# Patient Record
Sex: Male | Born: 1970 | Race: White | Hispanic: No | Smoking: Current every day smoker
Health system: Southern US, Community
[De-identification: ages and names within clinical notes are randomized; demographics above are authoritative.]

## PROBLEM LIST (undated history)

## (undated) DIAGNOSIS — S92009A Unspecified fracture of unspecified calcaneus, initial encounter for closed fracture: Secondary | ICD-10-CM

---

## 2018-06-26 ENCOUNTER — Other Ambulatory Visit: Payer: Self-pay

## 2018-06-26 ENCOUNTER — Emergency Department (HOSPITAL_COMMUNITY): Payer: PRIVATE HEALTH INSURANCE

## 2018-06-26 ENCOUNTER — Emergency Department (HOSPITAL_COMMUNITY)
Admission: EM | Admit: 2018-06-26 | Discharge: 2018-06-26 | Disposition: A | Discharge status: Home / Self Care | Payer: PRIVATE HEALTH INSURANCE | Attending: Emergency Medicine | Admitting: Emergency Medicine

## 2018-06-26 DIAGNOSIS — S99821A Other specified injuries of right foot, initial encounter: Secondary | ICD-10-CM | POA: Diagnosis present

## 2018-06-26 DIAGNOSIS — Y939 Activity, unspecified: Secondary | ICD-10-CM | POA: Diagnosis not present

## 2018-06-26 DIAGNOSIS — S92001A Unspecified fracture of right calcaneus, initial encounter for closed fracture: Secondary | ICD-10-CM | POA: Diagnosis not present

## 2018-06-26 DIAGNOSIS — Y990 Civilian activity done for income or pay: Secondary | ICD-10-CM

## 2018-06-26 DIAGNOSIS — Y998 Other external cause status: Secondary | ICD-10-CM | POA: Insufficient documentation

## 2018-06-26 DIAGNOSIS — W19XXXA Unspecified fall, initial encounter: Secondary | ICD-10-CM | POA: Insufficient documentation

## 2018-06-26 DIAGNOSIS — M25571 Pain in right ankle and joints of right foot: Secondary | ICD-10-CM

## 2018-06-26 DIAGNOSIS — Y9289 Other specified places as the place of occurrence of the external cause: Secondary | ICD-10-CM | POA: Insufficient documentation

## 2018-06-26 DIAGNOSIS — M79671 Pain in right foot: Secondary | ICD-10-CM

## 2018-06-26 NOTE — ED Notes (Signed)
Patient transported to CT 

## 2018-06-26 NOTE — ED Notes (Signed)
Ortho paged. 

## 2018-06-26 NOTE — ED Notes (Signed)
Pt verbalized understanding of discharge instructions and follow up care

## 2018-06-26 NOTE — Consult Note (Signed)
Reason for Consult:Right calc fx Referring Physician: [name] is an 48 y.o. male.  HPI: [name] [name] was working and was on the side of a dump truck when he slipped off and fell about 5-6 feet. He landed on his right foot and then twisted as he fell to the ground. He had immediate pain and could not bear weight on that foot. This happened Monday and he's been icing it and trying to tough it out but did go to UC today. X-rays showed a calcaneus fx and he was directed to the ED where orthopedic surgery was consulted.  No past medical history on file.  No family history on file.  Social History:  has no history on file for tobacco, alcohol, and drug.  Allergies: Not on File  Medications: I have reviewed the patient's current medications.  No results found for this or any previous visit (from the past 48 hour(s)).  Dg Tibia/fibula Right  Result Date: 06/26/2018 CLINICAL DATA:  Initial evaluation for acute trauma, fall. EXAM: RIGHT TIBIA AND FIBULA - 2 VIEW COMPARISON:  None. FINDINGS: Acute comminuted fracture involving the right calcaneus noted, better evaluated on concomitant radiographs of the right ankle and foot. Associated soft tissue swelling about the right ankle and right hindfoot. No other acute osseous abnormality about the right tibia/fibula. Osseous mineralization normal. No other soft tissue abnormality. Limited views of the right knee unremarkable. IMPRESSION: 1. Acute comminuted fracture involving the right calcaneus, better evaluated on concomitant radiographs of the right ankle and foot. 2. No other acute osseous abnormality about the right tibia/fibula. Electronically Signed   By: Jeannine Boga M.D.   On: 06/26/2018 13:49   Dg Ankle Complete Right  Result Date: 06/26/2018 CLINICAL DATA:  Initial evaluation for acute trauma, fall. EXAM: RIGHT ANKLE - COMPLETE 3+ VIEW COMPARISON:  Concomitant radiograph of the right foot. FINDINGS: Acute comminuted fracture of  the right calcaneus with intra-articular extension. No other acute fracture or dislocation. Talus grossly intact. Ankle mortise approximated. Diffuse soft tissue swelling about the ankle and hindfoot. IMPRESSION: Acute comminuted fracture of the right calcaneus with intra-articular extension. Electronically Signed   By: Jeannine Boga M.D.   On: 06/26/2018 13:51   Dg Foot Complete Right  Result Date: 06/26/2018 CLINICAL DATA:  Initial evaluation for acute trauma, fall. EXAM: RIGHT FOOT COMPLETE - 3+ VIEW COMPARISON:  None. FINDINGS: Acute comminuted fracture of the right calcaneus with intra-articular extension. Associated soft tissue swelling about the right ankle and hindfoot. No other acute fracture or dislocation. Joint spaces maintained. Osseous mineralization within normal limits. IMPRESSION: Acute comminuted fracture of the right calcaneus with intra-articular extension. Electronically Signed   By: Jeannine Boga M.D.   On: 06/26/2018 13:53    Review of Systems  Constitutional: Negative for weight loss.  HENT: Negative for ear discharge, ear pain, hearing loss and tinnitus.   Eyes: Negative for blurred vision, double vision, photophobia and pain.  Respiratory: Negative for cough, sputum production and shortness of breath.   Cardiovascular: Negative for chest pain.  Gastrointestinal: Negative for abdominal pain, nausea and vomiting.  Genitourinary: Negative for dysuria, flank pain, frequency and urgency.  Musculoskeletal: Positive for joint pain (Right foot). Negative for back pain, falls, myalgias and neck pain.  Neurological: Negative for dizziness, tingling, sensory change, focal weakness, loss of consciousness and headaches.  Endo/Heme/Allergies: Does not bruise/bleed easily.  Psychiatric/Behavioral: Negative for depression, memory loss and substance abuse. The patient is not nervous/anxious.    Blood pressure Marland Kitchen)  150/107, pulse 91, temperature 98.7 F (37.1 C), temperature  source Oral, resp. rate 16, height 5\' 11"  (1.803 m), weight 70.3 kg, SpO2 99 %. Physical Exam  Constitutional: He appears well-developed and well-nourished. No distress.  HENT:  Head: Normocephalic and atraumatic.  Eyes: Conjunctivae are normal. Right eye exhibits no discharge. Left eye exhibits no discharge. No scleral icterus.  Neck: Normal range of motion.  Cardiovascular: Normal rate and regular rhythm.  Respiratory: Effort normal. No respiratory distress.  Musculoskeletal:     Comments: RLE No traumatic wounds or rash, extensive ecchymoses foot, significant foot/ankle edema  Mod TTP heel  No knee effusion  Knee stable to varus/ valgus and anterior/posterior stress  Sens DPN, SPN, TN intact  Motor EHL, ext, flex, evers 5/5  DP 1+, PT 1+ (likely 2/2 edema)  Neurological: He is alert.  Skin: Skin is warm and dry. He is not diaphoretic.  Psychiatric: He has a normal mood and affect. His behavior is normal.    Assessment/Plan: Right calc fx -- Jones splint, NWB, f/u with Dr. Carola FrostHandy on Wednesday to discuss surgical fixation.    [name], PA-C Orthopedic Surgery [phone] 06/26/2018, 2:59 PM

## 2018-06-26 NOTE — ED Notes (Signed)
Pt to xray

## 2018-06-26 NOTE — ED Provider Notes (Signed)
[facility] Provider Note   CSN: [id number] Arrival date & time: 06/26/18  1230    History   Chief Complaint Chief Complaint  Patient presents with  . Work Related Injury    HPI [name] [name] is a 48 y.o. male with no significant past medical history presenting after a work injury onset 5 days ago. 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 Patient states fall was about 6 feet. Patient reports edema and bruising. Patient states he is not able to put pressure on right foot. Patient reports taking BC powder and applying ice with significant relief. Patient denies numbness or paresthesias. Patient describes pain as sharp with standing, but denies any pain at rest.  Patient states he was seen at an UC this morning and advised to come to the ER. Patient denies fever, chills, nausea, vomiting, neck pain, back pain, headache, dizziness, LOC, or head injury. Patient denies sick exposures or recent travel.      HPI  No past medical history on file.  There are no active problems to display for this patient.         Home Medications    Prior to Admission medications   Medication Sig Start Date End Date Taking? Authorizing Provider  Aspirin-Caffeine (BC FAST PAIN RELIEF PO) Take 1 packet by mouth daily as needed (headache).   Yes [provider]    Family History No family history on file.  Social History Social History   Tobacco Use  . Smoking status: Not on file  Substance Use Topics  . Alcohol use: Not on file  . Drug use: Not on file     Allergies   Patient has no known allergies.   Review of Systems Review of Systems  Constitutional: Negative for activity change, chills, diaphoresis and fever.  Respiratory: Negative for shortness of breath.   Cardiovascular: Negative for chest pain.  Gastrointestinal: Negative for abdominal pain,  nausea and vomiting.  Musculoskeletal: Positive for arthralgias and joint swelling. Negative for back pain, gait problem, myalgias, neck pain and neck stiffness.  Skin: Positive for color change. Negative for rash and wound.  Allergic/Immunologic: Negative for immunocompromised state.  Neurological: Negative for dizziness, weakness, numbness and headaches.  Hematological: Does not bruise/bleed easily.     Physical Exam Updated Vital Signs BP 124/86 (BP Location: Right Arm)   Pulse 74   Temp 98.7 F (37.1 C) (Oral)   Resp 16   Ht 5\' 11"  (1.803 m)   Wt 70.3 kg   SpO2 100%   BMI 21.62 kg/m   Physical Exam Vitals signs and nursing note reviewed.  Constitutional:      General: He is not in acute distress.    Appearance: He is well-developed. He is not diaphoretic.  HENT:     Head: Normocephalic and atraumatic.  Neck:     Musculoskeletal: Normal range of motion.  Cardiovascular:     Rate and Rhythm: Normal rate and regular rhythm.     Heart sounds: Normal heart sounds. No murmur. No friction rub. No gallop.   Pulmonary:     Effort: Pulmonary effort is normal. No respiratory distress.     Breath sounds: Normal breath sounds. No wheezing or rales.  Abdominal:     Palpations: Abdomen is soft.     Tenderness: There is no abdominal tenderness.  Musculoskeletal:     Right  knee: Normal. He exhibits normal range of motion, no swelling, no effusion, no ecchymosis and no deformity. No tenderness found.     Right ankle: He exhibits decreased range of motion, swelling and ecchymosis. He exhibits normal pulse. Tenderness. Lateral malleolus, medial malleolus and proximal fibula tenderness found. No AITFL, no CF ligament, no posterior TFL and no head of 5th metatarsal tenderness found. Achilles tendon exhibits no pain and no defect.     Left ankle: Normal. He exhibits normal range of motion, no swelling, no ecchymosis, no deformity, no laceration and normal pulse. No tenderness. No lateral  malleolus, no medial malleolus, no AITFL, no CF ligament, no posterior TFL, no head of 5th metatarsal and no proximal fibula tenderness found. Achilles tendon exhibits no pain and no defect.     Cervical back: He exhibits normal range of motion, no tenderness and no bony tenderness.     Thoracic back: He exhibits normal range of motion, no tenderness and no bony tenderness.     Lumbar back: Normal. He exhibits normal range of motion, no tenderness and no bony tenderness.     Right lower leg: He exhibits bony tenderness (Mild tenderness over the right proximal fibula. ).     Comments: Edema and ecchymosis present on right foot and ankle. Tenderness to palpation on the medial and lateral malleolus in the right ankle. Tenderness to palpation on the calcaneous of the right foot. Decreased ROM due to pain. Patient is able to move right toes. Patient is not able to put pressure on right ankle/foot. Decreased strength due to pain. 2+ radial pulses. Sensation intact.    Skin:    General: Skin is warm.     Findings: No erythema or rash.  Neurological:     Mental Status: He is alert.     ED Treatments / Results  Labs (all labs ordered are listed, but only abnormal results are displayed) Labs Reviewed - No data to display  EKG None  Radiology Dg Tibia/fibula Right  Result Date: 06/26/2018 CLINICAL DATA:  Initial evaluation for acute trauma, fall. EXAM: RIGHT TIBIA AND FIBULA - 2 VIEW COMPARISON:  None. FINDINGS: Acute comminuted fracture involving the right calcaneus noted, better evaluated on concomitant radiographs of the right ankle and foot. Associated soft tissue swelling about the right ankle and right hindfoot. No other acute osseous abnormality about the right tibia/fibula. Osseous mineralization normal. No other soft tissue abnormality. Limited views of the right knee unremarkable. IMPRESSION: 1. Acute comminuted fracture involving the right calcaneus, better evaluated on concomitant  radiographs of the right ankle and foot. 2. No other acute osseous abnormality about the right tibia/fibula. Electronically Signed   By: Jeannine Boga M.D.   On: 06/26/2018 13:49   Dg Ankle Complete Right  Result Date: 06/26/2018 CLINICAL DATA:  Initial evaluation for acute trauma, fall. EXAM: RIGHT ANKLE - COMPLETE 3+ VIEW COMPARISON:  Concomitant radiograph of the right foot. FINDINGS: Acute comminuted fracture of the right calcaneus with intra-articular extension. No other acute fracture or dislocation. Talus grossly intact. Ankle mortise approximated. Diffuse soft tissue swelling about the ankle and hindfoot. IMPRESSION: Acute comminuted fracture of the right calcaneus with intra-articular extension. Electronically Signed   By: Jeannine Boga M.D.   On: 06/26/2018 13:51   Ct Foot Right Wo Contrast  Result Date: 06/26/2018 CLINICAL DATA:  Calcaneus fracture on 06/22/2018. EXAM: CT OF THE RIGHT FOOT WITHOUT CONTRAST TECHNIQUE: Multidetector CT imaging of the right foot was performed according to the standard protocol. Multiplanar  CT image reconstructions were also generated. COMPARISON:  Radiographs dated 06/26/2018 FINDINGS: Bones/Joint/Cartilage There is a comminuted slightly impacted fracture of the calcaneus. The fracture involves the posterior and middle facets. There is slight depression of the fracture of the posterior facet. No depression of the fracture of the medial facet. The other bones of the foot and ankle are intact. Ligaments Suboptimally assessed by CT.  Intact. Muscles and Tendons No tendon entrapment.  No discrete muscle abnormality. Soft tissues Circumferential subcutaneous edema around the ankle and onto the foot. IMPRESSION: Comminuted calcaneal fracture as described. Electronically Signed   By: Francene BoyersJames  Maxwell M.D.   On: 06/26/2018 15:12   Dg Foot Complete Right  Result Date: 06/26/2018 CLINICAL DATA:  Initial evaluation for acute trauma, fall. EXAM: RIGHT FOOT  COMPLETE - 3+ VIEW COMPARISON:  None. FINDINGS: Acute comminuted fracture of the right calcaneus with intra-articular extension. Associated soft tissue swelling about the right ankle and hindfoot. No other acute fracture or dislocation. Joint spaces maintained. Osseous mineralization within normal limits. IMPRESSION: Acute comminuted fracture of the right calcaneus with intra-articular extension. Electronically Signed   By: Rise MuBenjamin  McClintock M.D.   On: 06/26/2018 13:53    Procedures Procedures (including critical care time)  Medications Ordered in ED Medications - No data to display   Initial Impression / Assessment and Plan / ED Course  I have reviewed the triage vital signs and the nursing notes.  Pertinent labs & imaging results that were available during my care of the patient were reviewed by me and considered in my medical decision making (see chart for details).  Clinical Course as of Jun 25 1616  Fri Jun 26, 2018  1356 Acute comminuted fracture of the right calcaneus with intra-articular extension.    DG Foot Complete Right [AH]  1516 Comminuted calcaneal fracture as described.  CT Foot Right Wo Contrast [AH]    Clinical Course User Index [AH] Leretha DykesHernandez, Vennie Salsbury P, PA-C      Patient presents with right foot and ankle pain. Patient X-Ray reveals acute comminuted fracture of the right calcaneus with intra-articular extension. No back tenderness to suspect a compression fracture at this time. Pain managed in the department without medications since patient denies any pain currently. Consulted orthopedics. Earney HamburgMichael Jeffrey, PA-C evaluated patient and recommends splint and orthopedic follow up. Patient given splint while in ED, conservative therapy recommended, and discussed. Advised patient to use crutches, keep leg elevated, and avoid putting weight down or right leg. Pt advised to follow up with orthopedics for further evaluation and treatment. Patient will be dc home & is agreeable  with above plan. I have also discussed reasons to return immediately to the ER.  Patient expresses understanding and agrees with plan.  Findings and plan of care discussed with supervising physician Dr. Patria Maneampos.  Final Clinical Impressions(s) / ED Diagnoses   Final diagnoses:  Acute right ankle pain  Right foot pain  Work related injury  Closed nondisplaced fracture of right calcaneus, unspecified portion of calcaneus, initial encounter    ED Discharge Orders    None       Glade StanfordHernandez, Sherice Ijames P, PA-C 06/26/18 1620    Azalia Bilisampos, Kevin, MD 06/26/18 1622

## 2018-06-26 NOTE — ED Notes (Signed)
Back from xray

## 2018-06-26 NOTE — Progress Notes (Signed)
Orthopedic Tech Progress Note Patient Details:  [name] [name] 1970/10/10 [id number]  Ortho Devices Type of Ortho Device: Ace wrap Ortho Device/Splint Location: right short leg splint Ortho Device/Splint Interventions: Application   Post Interventions Patient Tolerated: Well Instructions Provided: Care of device   [name] 06/26/2018, 4:08 PM

## 2018-06-26 NOTE — ED Triage Notes (Signed)
On Monday had work related injury.  Standing on a sidce latch of a dump truck and  Latch gave way and pt landed falling approx 6 feet onto back or R heel and twisting foot.   Seen at [place] with reports of dislocation and old healing fractrues.  Foot has considerable edema to lower leg with bruising noted to all toes.  CMS wnl.   Works for [employer].

## 2018-06-26 NOTE — Discharge Instructions (Signed)
Keep splint intact and dry.  Do not put weight down on right leg.  Keep leg elevated whenever possible.

## 2021-02-05 ENCOUNTER — Emergency Department (HOSPITAL_COMMUNITY): Payer: Self-pay

## 2021-02-05 ENCOUNTER — Other Ambulatory Visit: Payer: Self-pay

## 2021-02-05 ENCOUNTER — Encounter (HOSPITAL_COMMUNITY): Payer: Self-pay | Admitting: Emergency Medicine

## 2021-02-05 ENCOUNTER — Emergency Department (HOSPITAL_COMMUNITY)
Admission: EM | Admit: 2021-02-05 | Discharge: 2021-02-05 | Disposition: A | Discharge status: Home / Self Care | Payer: Self-pay | Attending: Emergency Medicine | Admitting: Emergency Medicine

## 2021-02-05 DIAGNOSIS — S81811A Laceration without foreign body, right lower leg, initial encounter: Secondary | ICD-10-CM

## 2021-02-05 DIAGNOSIS — Z23 Encounter for immunization: Secondary | ICD-10-CM | POA: Insufficient documentation

## 2021-02-05 DIAGNOSIS — W268XXA Contact with other sharp object(s), not elsewhere classified, initial encounter: Secondary | ICD-10-CM | POA: Insufficient documentation

## 2021-02-05 DIAGNOSIS — S81821A Laceration with foreign body, right lower leg, initial encounter: Secondary | ICD-10-CM | POA: Insufficient documentation

## 2021-02-05 DIAGNOSIS — Y990 Civilian activity done for income or pay: Secondary | ICD-10-CM | POA: Insufficient documentation

## 2021-02-05 MED ORDER — LIDOCAINE-EPINEPHRINE (PF) 2 %-1:200000 IJ SOLN
20.0000 mL | Freq: Once | INTRAMUSCULAR | Status: AC
  Administered 2021-02-05: 20 mL
  Filled 2021-02-05: qty 20

## 2021-02-05 MED ORDER — TETANUS-DIPHTH-ACELL PERTUSSIS 5-2.5-18.5 LF-MCG/0.5 IM SUSY
0.5000 mL | PREFILLED_SYRINGE | Freq: Once | INTRAMUSCULAR | Status: AC
  Administered 2021-02-05: 0.5 mL via INTRAMUSCULAR
  Filled 2021-02-05: qty 0.5

## 2021-02-05 MED ORDER — BACITRACIN ZINC 500 UNIT/GM EX OINT
TOPICAL_OINTMENT | Freq: Once | CUTANEOUS | Status: AC
  Filled 2021-02-05: qty 0.9

## 2021-02-05 NOTE — Discharge Instructions (Addendum)
You were seen in the emergency department today after sustaining a laceration to your leg.  While you were here we sutured it up.  You do not have any broken bones in the laceration.  Please keep the wound clean and dry.  I provided suture care instructions to your discharge paperwork.  Please refer to these.  Do not scrub the area.  You may have soap and water gently run over the area and pat dry.  You may use bacitracin, triple antibiotic ointment, Neosporin over the area and a dry dressing.  Please return to the emergency department if you have significant swelling, drainage that is white, milky or has an odor, fevers.  Otherwise you should return in 10 days to have your sutures removed.

## 2021-02-05 NOTE — ED Triage Notes (Signed)
Patient reports cutting his leg on a metal bar around 2pm today. Approx 5 inch laceration to r anterior shin.

## 2021-02-05 NOTE — ED Provider Notes (Signed)
[facility] DEPT Provider Note   CSN: [id number] Arrival date & time: 02/05/21  1807     History  Chief Complaint  Patient presents with   Laceration    [name] [name] is a 51 y.o. male. With no significant past medical history who presents to the emergency department with laceration.  Patient states this morning he was at work. 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 States that another employee was using a fork lift when they hit the trailer, knocking off the steel bar which struck his right lower leg. States that he immediately had pain. Decided to continue working throughout the day prior to presenting to ED. Unknown last tetanus.    Laceration     Home Medications Prior to Admission medications   Medication Sig Start Date End Date Taking? Authorizing Provider  Aspirin-Caffeine (BC FAST PAIN RELIEF PO) Take 1 packet by mouth daily as needed (headache).    [provider]      Allergies    Patient has no known allergies.    Review of Systems   Review of Systems  Skin:  Positive for wound.  All other systems reviewed and are negative.  Physical Exam Updated Vital Signs BP 109/73    Pulse 67    Temp 98.1 F (36.7 C) (Oral)    Resp 18    Ht 5\' 11"  (1.803 m)    Wt 74.8 kg    SpO2 97%    BMI 23.01 kg/m  Physical Exam Vitals and nursing note reviewed.  Constitutional:      General: He is not in acute distress.    Appearance: Normal appearance. He is not ill-appearing or toxic-appearing.  HENT:     Head: Normocephalic and atraumatic.  Eyes:     General: No scleral icterus.    Extraocular Movements: Extraocular movements intact.  Pulmonary:     Effort: Pulmonary effort is normal. No respiratory distress.  Musculoskeletal:        General: Tenderness and signs of injury present. No deformity. Normal range of motion.     Cervical back: Neck supple.     Right lower leg: No  edema.  Skin:    General: Skin is warm and dry.     Capillary Refill: Capillary refill takes less than 2 seconds.     Findings: Laceration present. No rash.          Comments: 4.5cm laceration to right shin. 2 abrasions next to laceration. Hemostatic. TTP   Neurological:     General: No focal deficit present.     Mental Status: He is alert and oriented to person, place, and time. Mental status is at baseline.  Psychiatric:        Mood and Affect: Mood normal.        Behavior: Behavior normal.        Thought Content: Thought content normal.        Judgment: Judgment normal.    ED Results / Procedures / Treatments   Labs (all labs ordered are listed, but only abnormal results are displayed) Labs Reviewed - No data to display  EKG None  Radiology DG Tibia/Fibula Right  Result Date: 02/05/2021 CLINICAL DATA:  Laceration, hit with metal bar EXAM: RIGHT TIBIA AND FIBULA - 2 VIEW COMPARISON:  06/26/2018 FINDINGS: Frontal and lateral views of the right tibia and fibula are obtained. No acute displaced fracture. Right  knee and ankle are well aligned. Mild soft tissue swelling involving the anterolateral right lower leg. No radiopaque foreign body. IMPRESSION: 1. Mild soft tissue swelling. No fracture or radiopaque foreign body. Electronically Signed   By: Randa Ngo M.D.   On: 02/05/2021 19:41    Procedures .Marland KitchenLaceration Repair  Date/Time: 02/06/2021 8:23 PM Performed by: Mickie Hillier, PA-C Authorized by: Mickie Hillier, PA-C   Consent:    Consent obtained:  Verbal   Consent given by:  Patient   Risks, benefits, and alternatives were discussed: yes     Risks discussed:  Infection, pain, retained foreign body, need for additional repair, poor cosmetic result and poor wound healing   Alternatives discussed:  No treatment and delayed treatment Universal protocol:    Procedure explained and questions answered to patient or proxy's satisfaction: yes     Relevant documents  present and verified: yes     Test results available: yes     Imaging studies available: yes     Required blood products, implants, devices, and special equipment available: yes     Site/side marked: yes     Immediately prior to procedure, a time out was called: yes     Patient identity confirmed:  Verbally with patient Anesthesia:    Anesthesia method:  Local infiltration   Local anesthetic:  Lidocaine 2% WITH epi Laceration details:    Location:  Leg   Leg location:  R lower leg   Length (cm):  4.5   Depth (mm):  3 Pre-procedure details:    Preparation:  Patient was prepped and draped in usual sterile fashion and imaging obtained to evaluate for foreign bodies Exploration:    Limited defect created (wound extended): no     Hemostasis achieved with:  Epinephrine   Imaging obtained: x-ray     Imaging outcome: foreign body not noted     Wound exploration: wound explored through full range of motion and entire depth of wound visualized     Wound extent: areolar tissue violated     Contaminated: yes   Treatment:    Area cleansed with:  Povidone-iodine and saline   Amount of cleaning:  Extensive   Irrigation solution:  Sterile saline   Irrigation volume:  1L   Irrigation method:  Pressure wash   Visualized foreign bodies/material removed: yes     Debridement:  Minimal   Undermining:  None   Scar revision: no   Skin repair:    Repair method:  Sutures   Suture size:  3-0   Suture material:  Prolene   Suture technique:  Simple interrupted   Number of sutures:  7 Approximation:    Approximation:  Close Repair type:    Repair type:  Intermediate Post-procedure details:    Dressing:  Antibiotic ointment and non-adherent dressing   Procedure completion:  Tolerated well, no immediate complications    Medications Ordered in ED Medications  lidocaine-EPINEPHrine (XYLOCAINE W/EPI) 2 %-1:200000 (PF) injection 20 mL (20 mLs Infiltration Given 02/05/21 1918)  Tdap (BOOSTRIX)  injection 0.5 mL (0.5 mLs Intramuscular Given 02/05/21 1921)  bacitracin ointment ( Topical Given 02/05/21 2108)    ED Course/ Medical Decision Making/ A&P                           Medical Decision Making Amount and/or Complexity of Data Reviewed Radiology: ordered.  Risk OTC drugs. Prescription drug management.  Patient presents to the ED with complaints  of laceration. This involves an extensive number of treatment options, and is a complaint that carries with it a low risk of complications and morbidity.   Additional history obtained:  Additional history obtained from: son at bedside  External records from outside source obtained and reviewed including: previous ED visits  Imaging Studies ordered:  I ordered imaging studies which included x-ray right tib/fib.  I independently reviewed & interpreted imaging & am in agreement with radiology impression. Imaging shows: No acute fracture or dislocation. No foreign bodies noted  Medications  I ordered medication including tetanus for prophylaxis, lidocaine w/ epi for anesthesia and hemostasis  Reevaluation of the patient after medication shows that patient improved  ED Course: 51 year old male who presents with work-related laceration to leg. No other injuries on exam.  Imaging obtained to rule out foreign body or fractures. Both negative.  Extensively cleaned and sutured laceration as noted in the procedural note. No immediate complications. Tetanus updated. No indication for antibiotics at this time. Provided suture care instructions verbally and in discharge attachments. Instructed to return in 10 days for suture removal or sooner for any signs of infection. He verbalizes understanding. Answer all of patient and son's questions at bedside. Will d/c with suture removal in 10 days. Well appearing and stable for d/c.   Dispostion:  After consideration of the diagnostic results and the patients response to treatment, I feel that the  patent would benefit from discharge. The patient has been appropriately medically screened and/or stabilized in the ED. I have low suspicion for any other emergent medical condition which would require further screening, evaluation or treatment in the ED or require inpatient management  Final Clinical Impression(s) / ED Diagnoses Final diagnoses:  Laceration of right lower extremity, initial encounter    Rx / DC Orders ED Discharge Orders     None         Mickie Hillier, PA-C 02/06/21 2030    Kommor, Debe Coder, MD 02/07/21 [phone]

## 2021-02-19 ENCOUNTER — Emergency Department (HOSPITAL_COMMUNITY)
Admission: EM | Admit: 2021-02-19 | Discharge: 2021-02-19 | Disposition: A | Discharge status: Home / Self Care | Payer: Self-pay | Attending: Emergency Medicine | Admitting: Emergency Medicine

## 2021-02-19 ENCOUNTER — Encounter (HOSPITAL_COMMUNITY): Payer: Self-pay

## 2021-02-19 DIAGNOSIS — L0390 Cellulitis, unspecified: Secondary | ICD-10-CM

## 2021-02-19 DIAGNOSIS — L03115 Cellulitis of right lower limb: Secondary | ICD-10-CM | POA: Insufficient documentation

## 2021-02-19 HISTORY — DX: Unspecified fracture of unspecified calcaneus, initial encounter for closed fracture: S92.009A

## 2021-02-19 MED ORDER — DOXYCYCLINE HYCLATE 100 MG PO CAPS: 100.0000 mg | ORAL_CAPSULE | Freq: Two times a day (BID) | ORAL | 0 refills | Status: AC

## 2021-02-19 MED ORDER — CEPHALEXIN 500 MG PO CAPS: 1000.0000 mg | ORAL_CAPSULE | Freq: Two times a day (BID) | ORAL | 0 refills | Status: AC

## 2021-02-19 NOTE — ED Triage Notes (Signed)
Patient reports that he cut his right shine on a metal bar on 02/06/20. Patient is here for a follow up.  Patient has a gaping area with yellow and black tissue in the laceration area. Patient's shin is red and swelling noted.

## 2021-02-19 NOTE — ED Provider Notes (Signed)
[facility] DEPT Provider Note   CSN: [id number] Arrival date & time: 02/19/21  [id number]     History  Chief Complaint  Patient presents with   laceration follow up    [name] [name] is a 51 y.o. male.  51 year old male who presents for wound check.  Patient seen 2 weeks ago and had a right anterior tibia laceration repaired.  States that the sutures unfortunately came out.  Since that time he has had some increased redness around the wound.  Denies any fever.  Has been keeping it covered      Home Medications Prior to Admission medications   Medication Sig Start Date End Date Taking? Authorizing Provider  Aspirin-Caffeine (BC FAST PAIN RELIEF PO) Take 1 packet by mouth daily as needed (headache).    [provider]      Allergies    Patient has no known allergies.    Review of Systems   Review of Systems  All other systems reviewed and are negative.  Physical Exam Updated Vital Signs BP (!) 143/70 (BP Location: Left Arm)    Pulse 68    Temp 98.2 F (36.8 C) (Oral)    Resp 16    Ht 1.803 m (5\' 11" )    Wt 72.2 kg    SpO2 100%    BMI 22.20 kg/m  Physical Exam Vitals and nursing note reviewed.  Constitutional:      General: He is not in acute distress.    Appearance: Normal appearance. He is well-developed. He is not toxic-appearing.  HENT:     Head: Normocephalic and atraumatic.  Eyes:     General: Lids are normal.     Conjunctiva/sclera: Conjunctivae normal.     Pupils: Pupils are equal, round, and reactive to light.  Neck:     Thyroid: No thyroid mass.     Trachea: No tracheal deviation.  Cardiovascular:     Rate and Rhythm: Normal rate and regular rhythm.     Heart sounds: Normal heart sounds. No murmur heard.   No gallop.  Pulmonary:     Effort: Pulmonary effort is normal. No respiratory distress.     Breath sounds: Normal breath sounds. No stridor. No decreased breath sounds, wheezing, rhonchi or rales.  Abdominal:      General: There is no distension.     Palpations: Abdomen is soft.     Tenderness: There is no abdominal tenderness. There is no rebound.  Musculoskeletal:        General: No tenderness. Normal range of motion.     Cervical back: Normal range of motion and neck supple.       Legs:  Skin:    General: Skin is warm and dry.     Findings: No abrasion or rash.  Neurological:     Mental Status: He is alert and oriented to person, place, and time. Mental status is at baseline.     GCS: GCS eye subscore is 4. GCS verbal subscore is 5. GCS motor subscore is 6.     Cranial Nerves: No cranial nerve deficit.     Sensory: No sensory deficit.     Motor: Motor function is intact.  Psychiatric:        Attention and Perception: Attention normal.        Speech: Speech normal.        Behavior: Behavior normal.    ED Results / Procedures / Treatments   Labs (all labs ordered are listed, but  only abnormal results are displayed) Labs Reviewed - No data to display  EKG None  Radiology No results found.  Procedures Procedures    Medications Ordered in ED Medications - No data to display  ED Course/ Medical Decision Making/ A&P                           Medical Decision Making  Patient is wound consistent with early cellulitis.  Compartments soft.  He has been afebrile here.  Low suspicion for serious etiology such as sepsis.  Will place on antibiotics and give referral to wound care center        Final Clinical Impression(s) / ED Diagnoses Final diagnoses:  None    Rx / DC Orders ED Discharge Orders     None         Lacretia Leigh, MD 02/19/21 1645

## 2021-03-05 ENCOUNTER — Encounter (HOSPITAL_BASED_OUTPATIENT_CLINIC_OR_DEPARTMENT_OTHER): Payer: Self-pay | Attending: Internal Medicine | Admitting: General Surgery

## 2021-03-05 ENCOUNTER — Other Ambulatory Visit: Payer: Self-pay

## 2021-03-05 DIAGNOSIS — W228XXA Striking against or struck by other objects, initial encounter: Secondary | ICD-10-CM | POA: Insufficient documentation

## 2021-03-05 DIAGNOSIS — Y990 Civilian activity done for income or pay: Secondary | ICD-10-CM | POA: Insufficient documentation

## 2021-03-05 DIAGNOSIS — F17210 Nicotine dependence, cigarettes, uncomplicated: Secondary | ICD-10-CM | POA: Insufficient documentation

## 2021-03-05 DIAGNOSIS — I10 Essential (primary) hypertension: Secondary | ICD-10-CM | POA: Insufficient documentation

## 2021-03-05 DIAGNOSIS — L97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: Secondary | ICD-10-CM | POA: Insufficient documentation

## 2021-03-05 NOTE — Progress Notes (Signed)
EMORY, OHAYON ([id number]) Visit Report for 03/05/2021 Abuse Risk Screen Details Patient Name: Date of Service: [name] [name], [name] [name] RD 03/05/2021 8:00 A M Medical Record Number: [id number] Patient Account Number: [id number] Date of Birth/Sex: Treating RN: August 23, 1970 (51 y.o. [name] [name] Primary Care Marylu Dudenhoeffer: PA [place], [place] Other Clinician: Referring Jeyda Siebel: Treating Bosten Newstrom/Extender: Henrine Screws Weeks in Treatment: 0 Abuse Risk Screen Items Answer ABUSE RISK SCREEN: Has anyone close to you tried to hurt or harm you recentlyo No Do you feel uncomfortable with anyone in your familyo No Has anyone forced you do things that you didnt want to doo No Electronic Signature(s) Signed: 03/05/2021 5:29:26 PM By: Dellie Catholic RN Entered By: Dellie Catholic on 03/05/2021 08:21:53 -------------------------------------------------------------------------------- Activities of Daily Living Details Patient Name: Date of Service: [name], [name] RD 03/05/2021 8:00 [place] Record Number: [id number] Patient Account Number: [id number] Date of Birth/Sex: Treating RN: 1970-11-05 (52 y.o. [name] [name] Primary Care Abrea Henle: PA [place], [place] Other Clinician: Referring Blondina Coderre: Treating Coston Mandato/Extender: Henrine Screws Weeks in Treatment: 0 Activities of Daily Living Items Answer Activities of Daily Living (Please select one for each item) Drive Automobile Completely Able T Medications ake Completely Able Use T elephone Completely Able Care for Appearance Completely Able Use T oilet Completely Able Bath / Shower Completely Able Dress Self Completely Able Feed Self Completely Able Walk Completely Able Get In / Out Bed Completely Able Housework Completely Able Prepare Meals Completely [place] for Self Completely Able Electronic Signature(s) Signed: 03/05/2021 5:29:26 PM By: Dellie Catholic RN Entered By:  Dellie Catholic on 03/05/2021 08:22:27 -------------------------------------------------------------------------------- Education Screening Details Patient Name: Date of Service: [name] [name] RD 03/05/2021 8:00 [place] Record Number: [id number] Patient Account Number: [id number] Date of Birth/Sex: Treating RN: 1970-04-12 (51 y.o. [name] [name] Primary Care Sharvi Mooneyhan: PA [place], [place] Other Clinician: Referring Ozro Russett: Treating Morissa Obeirne/Extender: Henrine Screws Weeks in Treatment: 0 Learning Preferences/Education Level/Primary Language Learning Preference: Explanation, Demonstration, Printed Material Highest Education Level: High School Preferred Language: English Cognitive Barrier Language Barrier: No Translator Needed: No Memory Deficit: No Emotional Barrier: No Cultural/Religious Beliefs Affecting Medical Care: No Physical Barrier Impaired Vision: No Impaired Hearing: No Decreased Hand dexterity: No Knowledge/Comprehension Knowledge Level: High Comprehension Level: High Ability to understand written instructions: High Ability to understand verbal instructions: High Motivation Anxiety Level: Calm Cooperation: Cooperative Education Importance: Acknowledges Need Interest in Health Problems: Asks Questions Perception: Coherent Willingness to Engage in Self-Management High Activities: Readiness to Engage in Self-Management High Activities: Electronic Signature(s) Signed: 03/05/2021 5:29:26 PM By: Dellie Catholic RN Entered By: Dellie Catholic on 03/05/2021 08:23:22 -------------------------------------------------------------------------------- Fall Risk Assessment Details Patient Name: Date of Service: [name] [name] RD 03/05/2021 8:00 A M Medical Record Number: [id number] Patient Account Number: [id number] Date of Birth/Sex: Treating RN: 14-Jan-1970 (51 y.o. [name] [name] Primary Care Jordell Outten: PA [place], [place] Other Clinician: Referring  Andera Cranmer: Treating Wade Sigala/Extender: Henrine Screws Weeks in Treatment: 0 Fall Risk Assessment Items Have you had 2 or more falls in the last 12 monthso 0 No Have you had any fall that resulted in injury in the last 12 monthso 0 No FALLS RISK SCREEN History of falling - immediate or within 3 months 0 No Secondary diagnosis (Do you have 2 or more medical diagnoseso) 0 No Ambulatory aid None/bed rest/wheelchair/nurse 0 No Crutches/cane/walker 0 No Furniture 0 No Intravenous therapy Access/Saline/Heparin Lock 0 No Gait/Transferring Normal/ bed rest/ wheelchair 0 No Weak (short steps with  or without shuffle, stooped but able to lift head while walking, may seek 0 No support from furniture) Impaired (short steps with shuffle, may have difficulty arising from chair, head down, impaired 0 No balance) Mental Status Oriented to own ability 0 No Electronic Signature(s) Signed: 03/05/2021 5:29:26 PM By: Dellie Catholic RN Entered By: Dellie Catholic on 03/05/2021 08:23:39 -------------------------------------------------------------------------------- Foot Assessment Details Patient Name: Date of Service: [name] [name] RD 03/05/2021 8:00 A M Medical Record Number: [id number] Patient Account Number: [id number] Date of Birth/Sex: Treating RN: [birth year] (51 y.o. [name] [name] Primary Care Jaray Boliver: PA [place], [place] Other Clinician: Referring Sharlynn Seckinger: Treating Eden Rho/Extender: Henrine Screws Weeks in Treatment: 0 Foot Assessment Items Site Locations + = Sensation present, - = Sensation absent, C = Callus, U = Ulcer R = Redness, W = Warmth, M = Maceration, PU = Pre-ulcerative lesion F = Fissure, S = Swelling, D = Dryness Assessment Right: Left: Other Deformity: No No Prior Foot Ulcer: No No Prior Amputation: No No Charcot Joint: No No Ambulatory Status: Ambulatory Without Help Gait: Steady Electronic Signature(s) Signed:  03/05/2021 5:29:26 PM By: Dellie Catholic RN Entered By: Dellie Catholic on 03/05/2021 08:28:38 -------------------------------------------------------------------------------- Nutrition Risk Screening Details Patient Name: Date of Service: [name], [name] RD 03/05/2021 8:00 A M Medical Record Number: [id number] Patient Account Number: [id number] Date of Birth/Sex: Treating RN: 04/14/1970 (51 y.o. [name] [name] Primary Care Bailee Thall: PA [place], [place] Other Clinician: Referring Arneda Sappington: Treating Narada Uzzle/Extender: Henrine Screws Weeks in Treatment: 0 Height (in): 71 Weight (lbs): 165 Body Mass Index (BMI): 23 Nutrition Risk Screening Items Score Screening NUTRITION RISK SCREEN: I have an illness or condition that made me change the kind and/or amount of food I eat 0 No I eat fewer than two meals per day 0 No I eat few fruits and vegetables, or milk products 0 No I have three or more drinks of beer, liquor or wine almost every day 0 No I have tooth or mouth problems that make it hard for me to eat 0 No I don't always have enough money to buy the food I need 0 No I eat alone most of the time 0 No I take three or more different prescribed or over-the-counter drugs a day 0 No Without wanting to, I have lost or gained 10 pounds in the last six months 0 No I am not always physically able to shop, cook and/or feed myself 0 No Nutrition Protocols Good Risk Protocol 0 No interventions needed Moderate Risk Protocol High Risk Proctocol Risk Level: Good Risk Score: 0 Electronic Signature(s) Signed: 03/05/2021 5:29:26 PM By: Dellie Catholic RN Entered By: Dellie Catholic on 03/05/2021 08:23:54

## 2021-03-05 NOTE — Progress Notes (Signed)
[name] [name], [name] [name] ([id number]) Visit Report for 03/05/2021 Allergy List Details Patient Name: Date of Service: [name] [name], [name] [name] 03/05/2021 8:00 A M Medical Record Number: [id number] Patient Account Number: [id number] Date of Birth/Sex: Treating RN: [birth year] (51 y.o. Collene Gobble Primary Care Earla Charlie: PA Haig Prophet, [place] Other Clinician: Referring Domanique Huesman: Treating Mikeisha Lemonds/Extender: Henrine Screws Weeks in Treatment: 0 Allergies Active Allergies No Known Allergies Allergy Notes Electronic Signature(s) Signed: 03/05/2021 5:29:26 PM By: Dellie Catholic RN Entered By: Dellie Catholic on 03/05/2021 [id number] -------------------------------------------------------------------------------- Arrival Information Details Patient Name: Date of Service: [name] [name] 03/05/2021 8:00 [place] Record Number: [id number] Patient Account Number: [id number] Date of Birth/Sex: Treating RN: Jan 28, 1970 (51 y.o. Collene Gobble Primary Care Calynn Ferrero: PA Haig [place], [place] Other Clinician: Referring Liadan Guizar: Treating Evea Sheek/Extender: Henrine Screws Weeks in Treatment: 0 Visit Information Patient Arrived: Ambulatory Arrival Time: 08:01 Accompanied By: friend Transfer Assistance: None Patient Identification Verified: Yes Secondary Verification Process Completed: Yes Patient Has Alerts: No Electronic Signature(s) Signed: 03/05/2021 5:29:26 PM By: Dellie Catholic RN Entered By: Dellie Catholic on 03/05/2021 08:02:26 -------------------------------------------------------------------------------- Clinic Level of Care Assessment Details Patient Name: Date of Service: [name] [name], [name] [name] 03/05/2021 8:00 [place] Record Number: [id number] Patient Account Number: [id number] Date of Birth/Sex: Treating RN: 27-Aug-1970 (51 y.o. Collene Gobble Primary Care Freddie Nghiem: Other Clinician: PA Darnelle Spangle Referring Mozelle Remlinger: Treating Brevan Luberto/Extender: Henrine Screws Weeks in Treatment: 0 Clinic Level of Care Assessment Items TOOL 1 Quantity Score X- 1 0 Use when EandM and Procedure is performed on INITIAL visit ASSESSMENTS - Nursing Assessment / Reassessment X- 1 20 General Physical Exam (combine w/ comprehensive assessment (listed just below) when performed on new pt. evals) X- 1 25 Comprehensive Assessment (HX, ROS, Risk Assessments, Wounds Hx, etc.) ASSESSMENTS - Wound and Skin Assessment / Reassessment []  - 0 Dermatologic / Skin Assessment (not related to wound area) ASSESSMENTS - Ostomy and/or Continence Assessment and Care []  - 0 Incontinence Assessment and Management []  - 0 Ostomy Care Assessment and Management (repouching, etc.) PROCESS - Coordination of Care X - Simple Patient / Family Education for ongoing care 1 15 []  - 0 Complex (extensive) Patient / Family Education for ongoing care X- 1 10 Staff obtains Programmer, systems, Records, T Results / Process Orders est X- 1 10 Staff telephones HHA, Nursing Homes / Clarify orders / etc []  - 0 Routine Transfer to another Facility (non-emergent condition) []  - 0 Routine Hospital Admission (non-emergent condition) X- 1 15 New Admissions / Biomedical engineer / Ordering NPWT Apligraf, etc. , []  - 0 Emergency Hospital Admission (emergent condition) PROCESS - Special Needs []  - 0 Pediatric / Minor Patient Management []  - 0 Isolation Patient Management []  - 0 Hearing / Language / Visual special needs []  - 0 Assessment of Community assistance (transportation, D/C planning, etc.) []  - 0 Additional assistance / Altered mentation []  - 0 Support Surface(s) Assessment (bed, cushion, seat, etc.) INTERVENTIONS - Miscellaneous []  - 0 External ear exam []  - 0 Patient Transfer (multiple staff / Civil Service fast streamer / Similar devices) X- 1 5 Simple Staple / Suture removal (25 or less) []  - 0 Complex Staple / Suture removal (26 or more) []  - 0 Hypo/Hyperglycemic  Management (do not check if billed separately) X- 1 15 Ankle / Brachial Index (ABI) - do not check if billed separately Has the patient been seen at the hospital within the last three years: Yes Total Score: 115 Level Of Care: New/Established - Level 3 Electronic  Signature(s) Signed: 03/05/2021 5:29:26 PM By: Karie SchwalbeScotton, Joanne RN Entered By: Karie SchwalbeScotton, Joanne on 03/05/2021 17:25:42 -------------------------------------------------------------------------------- Encounter Discharge Information Details Patient Name: Date of Service: [name] [name], [name] [name] 03/05/2021 8:00 A M Medical Record Number: [id number] Patient Account Number: [id number] Date of Birth/Sex: Treating RN: 08/25/1970 (51 y.o. Dianna LimboM) Scotton, Joanne Primary Care Kayti Poss: PA Zen[place], [place] Other Clinician: Referring Taylen Wendland: Treating Sarath Privott/Extender: Edwena Bundeannon, Jennifer Allen, Anthony Terrance Weeks in Treatment: 0 Encounter Discharge Information Items Post Procedure Vitals Discharge Condition: Stable Temperature (F): 98.5 Ambulatory Status: Ambulatory Pulse (bpm): 97 Discharge Destination: Home Respiratory Rate (breaths/min): 18 Transportation: Private Auto Blood Pressure (mmHg): 153/85 Accompanied By: son Schedule Follow-up Appointment: Yes Clinical Summary of Care: Patient Declined Electronic Signature(s) Signed: 03/05/2021 5:29:26 PM By: Karie SchwalbeScotton, Joanne RN Entered By: Karie SchwalbeScotton, Joanne on 03/05/2021 17:28:33 -------------------------------------------------------------------------------- Lower Extremity Assessment Details Patient Name: Date of Service: [name] [name], [name] [name] 03/05/2021 8:00 A M Medical Record Number: [id number] Patient Account Number: [id number] Date of Birth/Sex: Treating RN: 11/12/1970 (51 y.o. Dianna LimboM) Scotton, Joanne Primary Care Jenette Rayson: PA Zenovia [place], [place] Other Clinician: Referring Karter Hellmer: Treating Quindarrius Joplin/Extender: Edwena Bundeannon, Jennifer Allen, Anthony Terrance Weeks in Treatment: 0 Edema Assessment Assessed: [Left: No]  [Right: No] E[Left: dema] [Right: :] Calf Left: Right: Point of Measurement: 33 cm From Medial Instep 32.3 cm Ankle Left: Right: Point of Measurement: 10 cm From Medial Instep 19.5 cm Knee To Floor Left: Right: From Medial Instep 48 cm Vascular Assessment Pulses: Dorsalis Pedis Palpable: [Right:Yes] Blood Pressure: Brachial: [Right:153] Ankle: [Right:Dorsalis Pedis: 150 0.98] Electronic Signature(s) Signed: 03/05/2021 5:29:26 PM By: Karie SchwalbeScotton, Joanne RN Entered By: Karie SchwalbeScotton, Joanne on 03/05/2021 08:55:03 -------------------------------------------------------------------------------- Multi Wound Chart Details Patient Name: Date of Service: [name] [name], [name] [name] 03/05/2021 8:00 A M Medical Record Number: [id number] Patient Account Number: [id number] Date of Birth/Sex: Treating RN: 11/14/1970 (51 y.o. M) Primary Care Caitland Porchia: PA Zenovia JordanIENT, [place] Other Clinician: Referring Jasai Sorg: Treating Solangel Mcmanaway/Extender: Edwena Bundeannon, Jennifer Allen, Anthony Terrance Weeks in Treatment: 0 Vital Signs Height(in): 71 Pulse(bpm): 97 Weight(lbs): 165 Blood Pressure(mmHg): 153/88 Body Mass Index(BMI): 23 Temperature(F): 98.5 Respiratory Rate(breaths/min): 18 Photos: [1:No Photos Right, Medial, Anterior Lower Leg] [N/A:N/A N/A] Wound Location: [1:Trauma] [N/A:N/A] Wounding Event: [1:Trauma, Other] [N/A:N/A] Primary Etiology: [1:01/19/2021] [N/A:N/A] Date Acquired: [1:0] [N/A:N/A] Weeks of Treatment: [1:Open] [N/A:N/A] Wound Status: [1:No] [N/A:N/A] Wound Recurrence: [1:7.5x2.4x0.7] [N/A:N/A] Measurements L x W x D (cm) [1:14.137] [N/A:N/A] A (cm) : rea [1:9.896] [N/A:N/A] Volume (cm) : [1:0.00%] [N/A:N/A] % Reduction in A rea: [1:0.00%] [N/A:N/A] % Reduction in Volume: [1:1] Position 1 (o'clock): [1:2.5] Maximum Distance 1 (cm): [1:Yes] [N/A:N/A] Tunneling: [1:Full Thickness Without Exposed] [N/A:N/A] Classification: [1:Support Structures Medium] [N/A:N/A] Exudate A mount: [1:Serosanguineous]  [N/A:N/A] Exudate Type: [1:red, brown] [N/A:N/A] Exudate Color: [1:Small (1-33%)] [N/A:N/A] Granulation Amount: [1:Red] [N/A:N/A] Granulation Quality: [1:Large (67-100%)] [N/A:N/A] Necrotic Amount: [1:Eschar, Adherent Slough] [N/A:N/A] Necrotic Tissue: [1:Fat Layer (Subcutaneous Tissue): Yes N/A] Exposed Structures: [1:Fascia: No Tendon: No Muscle: No Joint: No Bone: No Debridement - Excisional] [N/A:N/A] Debridement: Pre-procedure Verification/Time Out 09:14 [N/A:N/A] Taken: [1:Lidocaine 5% topical ointment] [N/A:N/A] Pain Control: [1:Necrotic/Eschar, Subcutaneous,] [N/A:N/A] Tissue Debrided: [1:Slough Skin/Subcutaneous Tissue] [N/A:N/A] Level: [1:18] [N/A:N/A] Debridement A (sq cm): [1:rea Blade, Curette, Forceps] [N/A:N/A] Instrument: [1:Minimum] [N/A:N/A] Bleeding: [1:Pressure] [N/A:N/A] Hemostasis Achieved: [1:3] [N/A:N/A] Procedural Pain: [1:2] [N/A:N/A] Post Procedural Pain: Debridement Treatment Response: Procedure was tolerated well [N/A:N/A] Post Debridement Measurements L x 7.5x2.4x0.7 [N/A:N/A] W x D (cm) [1:9.896] [N/A:N/A] Post Debridement Volume: (cm) [1:Pt. still has suture in wound bed] [N/A:N/A] Assessment Notes: [1:Debridement] [N/A:N/A] Treatment Notes Electronic Signature(s) Signed: 03/05/2021 9:23:21 AM By: Duanne Guessannon, Jennifer MD FACS Signed:  03/05/2021 9:23:21 AM By: Fredirick Maudlin MD FACS Entered By: Fredirick Maudlin on 03/05/2021 09:23:21 -------------------------------------------------------------------------------- Multi-Disciplinary Care Plan Details Patient Name: Date of Service: [name] [name], [name] [name] 03/05/2021 8:00 A M Medical Record Number: [id number] Patient Account Number: [id number] Date of Birth/Sex: Treating RN: August 21, 1970 (51 y.o. Collene Gobble Primary Care Lelar Farewell: PA Haig [place], [place] Other Clinician: Referring Wing Schoch: Treating Rosmary Dionisio/Extender: Henrine Screws Weeks in Treatment: 0 Active Inactive Pain, Acute or  Chronic Nursing Diagnoses: Pain, acute or chronic: actual or potential Goals: Patient will verbalize adequate pain control and receive pain control interventions during procedures as needed Date Initiated: 03/05/2021 Target Resolution Date: 04/02/2021 Goal Status: Active Interventions: Complete pain assessment as per visit requirements Notes: Electronic Signature(s) Signed: 03/05/2021 5:29:26 PM By: Dellie Catholic RN Entered By: Dellie Catholic on 03/05/2021 17:23:25 -------------------------------------------------------------------------------- Pain Assessment Details Patient Name: Date of Service: [name] [name], [name] [name] 03/05/2021 8:00 [place] Record Number: [id number] Patient Account Number: [id number] Date of Birth/Sex: Treating RN: 1970-09-09 (51 y.o. Collene Gobble Primary Care Ivania Teagarden: PA Haig [place], [place] Other Clinician: Referring Kerolos Nehme: Treating Pace Lamadrid/Extender: Henrine Screws Weeks in Treatment: 0 Active Problems Location of Pain Severity and Description of Pain Patient Has Paino Yes Site Locations Pain Location: Pain Location: Pain in Ulcers With Dressing Change: No Duration of the Pain. Constant / Intermittento Constant Rate the pain. Current Pain Level: 6 Worst Pain Level: 10 Least Pain Level: 4 Tolerable Pain Level: 4 Character of Pain Describe the Pain: Sharp Pain Management and Medication Current Pain Management: Medication: Yes Cold Application: No Rest: Yes Massage: No Activity: No T.E.N.S.: No Heat Application: No Leg drop or elevation: No Is the Current Pain Management Adequate: Adequate How does your wound impact your activities of daily livingo Sleep: No Bathing: No Appetite: No Relationship With Others: No Bladder Continence: No Emotions: No Bowel Continence: No Work: No Toileting: No Drive: No Dressing: No Hobbies: No Electronic Signature(s) Signed: 03/05/2021 5:29:26 PM By: Dellie Catholic RN Entered  By: Dellie Catholic on 03/05/2021 08:56:02 -------------------------------------------------------------------------------- Patient/Caregiver Education Details Patient Name: Date of Service: [name] [name] 2/27/2023andnbsp8:00 A M Medical Record Number: [id number] Patient Account Number: [id number] Date of Birth/Gender: Treating RN: [birth year] (51 y.o. Collene Gobble Primary Care Physician: PA Haig [place], [place] Other Clinician: Referring Physician: Treating Physician/Extender: Kirby Crigler in Treatment: 0 Education Assessment Education Provided To: Patient Education Topics Provided Pain: Methods: Explain/Verbal Responses: Return demonstration correctly Electronic Signature(s) Signed: 03/05/2021 5:29:26 PM By: Dellie Catholic RN Entered By: Dellie Catholic on 03/05/2021 17:23:37 -------------------------------------------------------------------------------- Wound Assessment Details Patient Name: Date of Service: [name] [name], [name] [name] 03/05/2021 8:00 [place] Record Number: [id number] Patient Account Number: [id number] Date of Birth/Sex: Treating RN: 01-May-1970 (51 y.o. Collene Gobble Primary Care Jurnei Latini: PA Haig [place], [place] Other Clinician: Referring Camry Robello: Treating Chyenne Sobczak/Extender: Henrine Screws Weeks in Treatment: 0 Wound Status Wound Number: 1 Primary Etiology: Trauma, Other Wound Location: Right, Medial, Anterior Lower Leg Wound Status: Open Wounding Event: Trauma Date Acquired: 01/19/2021 Weeks Of Treatment: 0 Clustered Wound: No Wound Measurements Length: (cm) 7.5 Width: (cm) 2.4 Depth: (cm) 0.7 Area: (cm) 14.137 Volume: (cm) 9.896 % Reduction in Area: 0% % Reduction in Volume: 0% Tunneling: Yes Position (o'clock): 1 Maximum Distance: (cm) 2.5 Undermining: No Wound Description Classification: Full Thickness Without Exposed Support Structures Exudate Amount: Medium Exudate Type:  Serosanguineous Exudate Color: red, brown Foul Odor After Cleansing: No Slough/Fibrino Yes Wound Bed Granulation Amount: Small (1-33%) Exposed Structure Granulation Quality: Red  Fascia Exposed: No Necrotic Amount: Large (67-100%) Fat Layer (Subcutaneous Tissue) Exposed: Yes Necrotic Quality: Eschar, Adherent Slough Tendon Exposed: No Muscle Exposed: No Joint Exposed: No Bone Exposed: No Assessment Notes Pt. still has suture in wound bed Treatment Notes Wound #1 (Lower Leg) Wound Laterality: Right, Medial, Anterior Cleanser Soap and Water Discharge Instruction: May shower and wash wound with dial antibacterial soap and water prior to dressing change. Wound Cleanser Discharge Instruction: Cleanse the wound with wound cleanser prior to applying a clean dressing using gauze sponges, not tissue or cotton balls. Peri-Wound Care Topical Primary Dressing Iodosorb Gel 10 (gm) Tube Discharge Instruction: Apply to wound bed as instructed Gentamicin Discharge Instruction: place into wound bed Secondary Dressing Woven Gauze Sponge, Non-Sterile 4x4 in Discharge Instruction: Apply over primary dressing as directed. Secured With [employer], 4.5x3.1 (in/yd) Discharge Instruction: Secure with Kerlix as directed. 26M Medipore H Soft Cloth Surgical T ape, 4 x 10 (in/yd) Discharge Instruction: Secure with tape as directed. Compression Wrap Compression Stockings Add-Ons Electronic Signature(s) Signed: 03/05/2021 5:29:26 PM By: Dellie Catholic RN Entered By: Dellie Catholic on 03/05/2021 08:47:51 -------------------------------------------------------------------------------- Vitals Details Patient Name: Date of Service: [name] [name] 03/05/2021 8:00 A M Medical Record Number: [id number] Patient Account Number: [id number] Date of Birth/Sex: Treating RN: 07-25-1970 (51 y.o. Collene Gobble Primary Care Staria Birkhead: PA Haig Prophet, [place] Other Clinician: Referring Keydi Giel: Treating  Honora Searson/Extender: Henrine Screws Weeks in Treatment: 0 Vital Signs Time Taken: 08:02 Temperature (F): 98.5 Height (in): 71 Pulse (bpm): 97 Source: Stated Respiratory Rate (breaths/min): 18 Weight (lbs): 165 Blood Pressure (mmHg): 153/88 Source: Stated Reference Range: 80 - 120 mg / dl Body Mass Index (BMI): 23 Electronic Signature(s) Signed: 03/05/2021 5:29:26 PM By: Dellie Catholic RN Entered By: Dellie Catholic on 03/05/2021 08:05:35

## 2021-03-06 NOTE — Progress Notes (Signed)
[name] [name], [name] [name] ([id number]) Visit Report for 03/05/2021 Chief Complaint Document Details Patient Name: Date of Service: [name] [name], [name] [name] 03/05/2021 8:00 A M Medical Record Number: [id number] Patient Account Number: [id number] Date of Birth/Sex: Treating RN: 08/06/1970 (51 y.o. M) Primary Care Provider: PA Zenovia JordanIENT, NO Other Clinician: Referring Provider: Treating Provider/Extender: Edwena Bundeannon, Elnoria Livingston Allen, Anthony Terrance Weeks in Treatment: 0 Information Obtained from: Patient Chief Complaint ADMISSION: 03/05/2021: Patient seen for complaints of Non-Healing Wound. Electronic Signature(s) Signed: 03/05/2021 9:23:47 AM By: Duanne Guessannon, Kingsly Kloepfer MD FACS Entered By: Duanne Guessannon, Haze Antillon on 03/05/2021 09:23:47 -------------------------------------------------------------------------------- Debridement Details Patient Name: Date of Service: [name] [name], [name] [name] 03/05/2021 8:00 A M Medical Record Number: [id number] Patient Account Number: [id number] Date of Birth/Sex: Treating RN: 03/30/1970 (51 y.o. Dianna LimboM) Scotton, Joanne Primary Care Provider: PA Zenovia JordanIENT, [place] Other Clinician: Referring Provider: Treating Provider/Extender: Edwena Bundeannon, Gorge Almanza Allen, Anthony Terrance Weeks in Treatment: 0 Debridement Performed for Assessment: Wound #1 Right,Medial,Anterior Lower Leg Performed By: Physician Duanne Guessannon, Akaila Rambo, MD Debridement Type: Debridement Level of Consciousness (Pre-procedure): Awake and Alert Pre-procedure Verification/Time Out Yes - 09:14 Taken: Start Time: 09:14 Pain Control: Lidocaine 5% topical ointment T Area Debrided (L x W): otal 7.5 (cm) x 2.4 (cm) = 18 (cm) Tissue and other material debrided: Non-Viable, Eschar, Slough, Subcutaneous, Slough Level: Skin/Subcutaneous Tissue Debridement Description: Excisional Instrument: Blade, Curette, Forceps Specimen: Tissue Culture Number of Specimens T aken: 1 Bleeding: Minimum Hemostasis Achieved: Pressure End Time: 09:18 Procedural Pain: 3 Post Procedural Pain:  2 Response to Treatment: Procedure was tolerated well Level of Consciousness (Post- Awake and Alert procedure): Post Debridement Measurements of Total Wound Length: (cm) 7.5 Width: (cm) 2.4 Depth: (cm) 0.7 Volume: (cm) 9.896 Character of Wound/Ulcer Post Debridement: Improved Post Procedure Diagnosis Same as Pre-procedure Notes Periosteum culture Electronic Signature(s) Signed: 03/05/2021 9:38:57 AM By: Duanne Guessannon, Shaquaya Wuellner MD FACS Signed: 03/05/2021 5:29:26 PM By: Karie SchwalbeScotton, Joanne RN Entered By: Karie SchwalbeScotton, Joanne on 03/05/2021 09:21:30 -------------------------------------------------------------------------------- HPI Details Patient Name: Date of Service: [name] [name], [name] [name] 03/05/2021 8:00 A M Medical Record Number: [id number] Patient Account Number: [id number] Date of Birth/Sex: Treating RN: 11/08/1970 (51 y.o. M) Primary Care Provider: PA Zenovia JordanIENT, NO Other Clinician: Referring Provider: Treating Provider/Extender: Edwena Bundeannon, Eero Dini Allen, Anthony Terrance Weeks in Treatment: 0 History of Present Illness HPI Description: ADMISSION 03/05/2021: This is a 51 year old man who was injured at work when a metal bar struck his right lower leg. He presented to the emergency department at [facility] where he underwent radiographic imaging that did not show a fracture. The laceration was sutured and the patient was discharged from the emergency room. He returned to the emergency room to weeks later with separation of his wound. At that time, no additional studies were performed, but he was placed on a course of antibiotics (doxycycline and Keflex). He completed those about a week ago. He has continued to have significant pain as well as drainage from the site. He has been applying triple antibiotic ointment and a nonstick gauze to the site without significant improvement. He has not experienced any fevers or chills. There is some drainage, but significantly less than prior, per his report. He is  accompanied by his son today. He is otherwise healthy without any significant medical history. He takes no prescribed home medications. CLINICAL DATA: Laceration, hit with metal bar EXAM: RIGHT TIBIA AND FIBULA - 2 VIEW COMPARISON: 06/26/2018 FINDINGS: Frontal and lateral views of the right tibia and fibula are obtained. No acute displaced fracture. Right knee and ankle are well aligned. Mild soft tissue swelling involving the  anterolateral right lower leg. No radiopaque foreign body. IMPRESSION: 1. Mild soft tissue swelling. No fracture or radiopaque foreign body. No labs were performed at either emergency department evaluation. Electronic Signature(s) Signed: 03/05/2021 9:28:01 AM By: [name] MD FACS Entered By: [name] on 03/05/2021 09:28:01 -------------------------------------------------------------------------------- Physical Exam Details Patient Name: Date of Service: [name] [name], [name] [name] 03/05/2021 8:00 A M Medical Record Number: [id number] Patient Account Number: [id number] Date of Birth/Sex: Treating RN: 05/25/1970 (51 y.o. M) Primary Care Provider: PA Zenovia Jordan, NO Other Clinician: Referring Provider: Treating Provider/Extender: Edwena Bunde Weeks in Treatment: 0 Constitutional He is hypertensive.. . . . Respiratory Normal work of breathing on room air.. Cardiovascular ABI is normal at 0.98 on the right. Notes 03/05/2021: Wound evaluationthere is a vertical wound along the anterior tibial surface. There is adherent black eschar containing multiple Prolene sutures. There is a tunnel tracking at the most cranial aspect of this. There is necrotic muscle and I am able to palpate bone at the base. Fibrinous slough is present over the entire surface of the wound beneath the eschar, which I removed with a scalpel. I further debrided the slough with a #5 curette. The wound is malodorous, without frank pus. There is surrounding erythema of the  soft tissues. I took a scraping of the patient's periosteum to send for culture. Electronic Signature(s) Signed: 03/05/2021 9:32:19 AM By: [name] MD FACS Entered By: [name] on 03/05/2021 09:32:18 -------------------------------------------------------------------------------- Physician Orders Details Patient Name: Date of Service: [name] Endo [name] 03/05/2021 8:00 A M Medical Record Number: [id number] Patient Account Number: [id number] Date of Birth/Sex: Treating RN: Mar 02, 1970 (50 y.o. 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 - **** Friday March 3rd 2023 Dr Lady Gary Return A Nurse Visit: - ****Wednesday March 1st Bathing/ Shower/ Hygiene May shower with protection but do not get wound dressing(s) wet. - Protect wound area on right lower leg Edema Control - Lymphedema / SCD / Other Right Lower Extremity Elevate legs to the level of the heart or above for 30 minutes daily and/or when sitting, a frequency of: - Throughout the day Avoid standing for long periods of time. Wound Treatment Wound #1 - Lower Leg Wound Laterality: Right, Medial, Anterior Cleanser: Soap and Water Every Other Day/30 Days Discharge Instructions: May shower and wash wound with dial antibacterial soap and water prior to dressing change. Cleanser: Wound Cleanser Every Other Day/30 Days Discharge Instructions: Cleanse the wound with wound cleanser prior to applying a clean dressing using gauze sponges, not tissue or cotton balls. Prim Dressing: Iodosorb Gel 10 (gm) Tube Every Other Day/30 Days ary Discharge Instructions: Apply to wound bed as  instructed Prim Dressing: Gentamicin Every Other Day/30 Days ary Discharge Instructions: place into wound bed Secondary Dressing: Woven Gauze Sponge, Non-Sterile 4x4 in Every Other Day/30 Days Discharge Instructions: Apply over primary dressing as directed. Secured With: [employer], 4.5x3.1 (in/yd) Every Other Day/30 Days Discharge Instructions: Secure with Kerlix as directed. Secured With: 70M Medipore H Soft Cloth Surgical T ape, 4 x 10 (in/yd) Every Other Day/30 Days Discharge Instructions: Secure with tape as directed. Laboratory naerobe culture (MICRO) - Culture Periosteum of Right Anterior Lower Leg Bacteria identified in Unspecified specimen by A LOINC  Code: 635-3 Convenience Name: Anaerobic culture Electronic Signature(s) Signed: 03/05/2021 9:37:57 AM By: [name] MD FACS Previous Signature: 03/05/2021 9:32:36 AM Version By: [name] MD FACS Entered By: [name] on 03/05/2021 09:37:57 Prescription 03/05/2021 -------------------------------------------------------------------------------- Rene Paci MD Patient Name: Provider: 1970/11/30 [id number] Date of Birth: NPI#: Judie Petit [id number] Sex: DEA #: [phone] Phone #: License #: Eligha Bridegroom [facility] Patient Address: [address] [facility] [place] [address] [place], [place] [address] [address] [place], [place] [address] [phone] Allergies No Known Allergies Provider's Orders naerobe culture - Culture Periosteum of Right Anterior Lower Leg Bacteria identified in Unspecified specimen by A LOINC Code: 635-3 Convenience Name: Anaerobic culture Hand Signature: Date(s): Electronic Signature(s) Signed: 03/05/2021 9:38:57 AM By: [name] MD FACS Previous Signature: 03/05/2021 9:35:19 AM Version By: [name] MD FACS Entered By: [name] on 03/05/2021  09:37:57 -------------------------------------------------------------------------------- Problem List Details Patient Name: Date of Service: [name] Endo [name] 03/05/2021 8:00 A M Medical Record Number: [id number] Patient Account Number: [id number] Date of Birth/Sex: Treating RN: November 16, 1970 (51 y.o. M) Primary Care Provider: Other Clinician: PA TIENT, NO Referring Provider: Treating Provider/Extender: Edwena Bunde Weeks in Treatment: 0 Active Problems ICD-10 Encounter Code Description Active Date MDM Diagnosis L97.813 Non-pressure chronic ulcer of other part of right lower leg with necrosis of 03/05/2021 No Yes muscle Inactive Problems Resolved Problems Electronic Signature(s) Signed: 03/05/2021 9:22:57 AM By: [name] MD FACS Entered By: [name] on 03/05/2021 09:22:57 -------------------------------------------------------------------------------- Progress Note Details Patient Name: Date of Service: [name] Endo [name] 03/05/2021 8:00 A M Medical Record Number: [id number] Patient Account Number: [id number] Date of Birth/Sex: Treating RN: 06/11/1970 (51 y.o. M) Primary Care Provider: PA Zenovia Jordan, NO Other Clinician: Referring Provider: Treating Provider/Extender: Edwena Bunde Weeks in Treatment: 0 Subjective Chief Complaint Information obtained from Patient ADMISSION: 03/05/2021: Patient seen for complaints of Non-Healing Wound. History of Present Illness (HPI) ADMISSION 03/05/2021: This is a 52 year old man who was injured at work when a metal bar struck his right lower leg. He presented to the emergency department at [facility] where he underwent radiographic imaging that did not show a fracture. The laceration was sutured and the patient was discharged from the emergency room. He returned to the emergency room to weeks later with separation of his wound. At that time, no additional studies were performed,  but he was placed on a course of antibiotics (doxycycline and Keflex). He completed those about a week ago. He has continued to have significant pain as well as drainage from the site. He has been applying triple antibiotic ointment and a nonstick gauze to the site without significant improvement. He has not experienced any fevers or chills. There is some drainage, but significantly less than prior, per his report. He is accompanied by his son today. He is otherwise healthy without any significant medical history. He takes no prescribed home medications. CLINICAL DATA: Laceration, hit with metal bar EXAM: RIGHT TIBIA AND FIBULA - 2 VIEW COMPARISON: 06/26/2018 FINDINGS: Frontal and lateral views of the right tibia and fibula are obtained. No acute displaced fracture. Right knee and ankle are well aligned. Mild soft tissue swelling involving the anterolateral right lower leg. No radiopaque foreign body. IMPRESSION: 1. Mild soft tissue swelling. No fracture or radiopaque foreign body. No labs were performed at either emergency department evaluation. Patient History Information obtained from Patient. Allergies No Known Allergies Social History Current some day smoker - 1 pack per day -  started on 11/07/1991, Marital Status - Divorced, Alcohol Use - Rarely, Drug Use - No History, Caffeine Use - Rarely. Medical History Integumentary (Skin) Denies history of History of Burn Hospitalization/Surgery History - Tooth extractions. Medical A Surgical History Notes nd Musculoskeletal Heel Fracture Review of Systems (ROS) Constitutional Symptoms (General Health) Denies complaints or symptoms of Fatigue, Fever, Chills, Marked Weight Change. Eyes Denies complaints or symptoms of Dry Eyes, Vision Changes, Glasses / Contacts. Ear/Nose/Mouth/Throat Denies complaints or symptoms of Chronic sinus problems or rhinitis. Respiratory Denies complaints or symptoms of Chronic or frequent coughs,  Shortness of Breath. Cardiovascular Denies complaints or symptoms of Chest pain. Gastrointestinal Denies complaints or symptoms of Frequent diarrhea, Nausea, Vomiting. Endocrine Denies complaints or symptoms of Heat/cold intolerance. Genitourinary Denies complaints or symptoms of Frequent urination. Integumentary (Skin) Complains or has symptoms of Wounds - Right anterior lower leg. Musculoskeletal Denies complaints or symptoms of Muscle Pain, Muscle Weakness. Neurologic Denies complaints or symptoms of Numbness/parasthesias. Psychiatric Denies complaints or symptoms of Claustrophobia, Suicidal. Objective Constitutional He is hypertensive.. Vitals Time Taken: 8:02 AM, Height: 71 in, Source: Stated, Weight: 165 lbs, Source: Stated, BMI: 23, Temperature: 98.5 F, Pulse: 97 bpm, Respiratory Rate: 18 breaths/min, Blood Pressure: 153/88 mmHg. Respiratory Normal work of breathing on room air.. Cardiovascular ABI is normal at 0.98 on the right. General Notes: 03/05/2021: Wound evaluationoothere is a vertical wound along the anterior tibial surface. There is adherent black eschar containing multiple Prolene sutures. There is a tunnel tracking at the most cranial aspect of this. There is necrotic muscle and I am able to palpate bone at the base. Fibrinous slough is present over the entire surface of the wound beneath the eschar, which I removed with a scalpel. I further debrided the slough with a #5 curette. The wound is malodorous, without frank pus. There is surrounding erythema of the soft tissues. I took a scraping of the patient's periosteum to send for culture. Integumentary (Hair, Skin) Wound #1 status is Open. Original cause of wound was Trauma. The date acquired was: 01/19/2021. The wound is located on the Right,Medial,Anterior Lower Leg. The wound measures 7.5cm length x 2.4cm width x 0.7cm depth; 14.137cm^2 area and 9.896cm^3 volume. There is Fat Layer (Subcutaneous  Tissue) exposed. There is no undermining noted, however, there is tunneling at 1:00 with a maximum distance of 2.5cm. There is a medium amount of serosanguineous drainage noted. There is small (1-33%) red granulation within the wound bed. There is a large (67-100%) amount of necrotic tissue within the wound bed including Eschar and Adherent Slough. General Notes: Pt. 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 There was a Excisional Skin/Subcutaneous Tissue Debridement with a total area of 18 sq cm performed by [name], MD. With the following instrument(s): Blade, Curette, and Forceps to remove Non- Viable tissue/material. Material removed includes Eschar, Subcutaneous Tissue, and Slough after achieving pain control using Lidocaine 5% topical ointment. 1 specimen was taken by a Tissue Culture and sent to the lab per facility protocol. A time out was conducted at 09:14, prior to the start of the procedure. A Minimum amount of bleeding was controlled with Pressure. The procedure was tolerated well with a pain level of 3 throughout and a pain level of 2 following the procedure. Post Debridement Measurements: 7.5cm length x 2.4cm  width x 0.7cm depth; 9.896cm^3 volume. Character of Wound/Ulcer Post Debridement is improved. Post procedure Diagnosis Wound #1: Same as Pre-Procedure General Notes: Periosteum culture. Plan Follow-up Appointments: Return Appointment in 1 week. - **** Friday March 3rd 2023 Dr Lady Gary Nurse Visit: - ****Wednesday March 1st Bathing/ Shower/ Hygiene: May shower with protection but do not get wound dressing(s) wet. - Protect wound area on right lower leg Edema Control - Lymphedema / SCD / Other: Elevate legs to the level of the heart or  above for 30 minutes daily and/or when sitting, a frequency of: - Throughout the day Avoid standing for long periods of time. Laboratory ordered were: Anaerobic culture - Culture Periosteum of Right Anterior Lower Leg WOUND #1: - Lower Leg Wound Laterality: Right, Medial, Anterior Cleanser: Soap and Water Every Other Day/30 Days Discharge Instructions: May shower and wash wound with dial antibacterial soap and water prior to dressing change. Cleanser: Wound Cleanser Every Other Day/30 Days Discharge Instructions: Cleanse the wound with wound cleanser prior to applying a clean dressing using gauze sponges, not tissue or cotton balls. Prim Dressing: Iodosorb Gel 10 (gm) Tube Every Other Day/30 Days ary Discharge Instructions: Apply to wound bed as instructed Prim Dressing: Gentamicin Every Other Day/30 Days ary Discharge Instructions: place into wound bed Secondary Dressing: Woven Gauze Sponge, Non-Sterile 4x4 in Every Other Day/30 Days Discharge Instructions: Apply over primary dressing as directed. Secured With: [employer], 4.5x3.1 (in/yd) Every Other Day/30 Days Discharge Instructions: Secure with Kerlix as directed. Secured With: 72M Medipore H Soft Cloth Surgical T ape, 4 x 10 (in/yd) Every Other Day/30 Days Discharge Instructions: Secure with tape as directed. 03/05/2021: This is a 51 year old man who sustained a traumatic injury at work. The wound has failed to close and has actually separated after suturing. I removed eschar and old sutures. There is necrotic muscle and the wound does probe to bone along the tibia. Significant debridement was performed, however I was unable to completely debride the wound secondary to patient discomfort. We will start with topical gentamicin ointment as well as Iodoflex, packing this into the tunneling wound. We will cover it with a nonstick dressing. The patient is self-pay and has no insurance. I would like him to return on Wednesday for nurse  visit and then I will see him again on Friday to reevaluate the wound. Hopefully, the culture data will be available at that time. If the wound is not significantly improved, we may switch to collagenase for better debridement. I think with adequate care, he will be able to heal this wound, but I am concerned about the exposed bone. Electronic Signature(s) Signed: 03/05/2021 9:38:16 AM By: [name] MD FACS Previous Signature: 03/05/2021 9:34:56 AM Version By: [name] MD FACS Entered By: [name] on 03/05/2021 09:38:16 -------------------------------------------------------------------------------- HxROS Details Patient Name: Date of Service: [name] Endo [name] 03/05/2021 8:00 A M Medical Record Number: [id number] Patient Account Number: [id number] Date of Birth/Sex: Treating RN: 10/23/1970 (51 y.o. M) Primary Care Provider: PA Zenovia Jordan, NO Other Clinician: Referring Provider: Treating Provider/Extender: Edwena Bunde Weeks in Treatment: 0 Information Obtained From Patient Constitutional Symptoms (General Health) Complaints and Symptoms: Negative for: Fatigue; Fever; Chills; Marked Weight Change Eyes Complaints and Symptoms: Negative for: Dry Eyes; Vision Changes; Glasses / Contacts Ear/Nose/Mouth/Throat Complaints and Symptoms: Negative for: Chronic sinus problems or rhinitis Respiratory Complaints and Symptoms: Negative for: Chronic or frequent coughs; Shortness of Breath Cardiovascular Complaints and Symptoms: Negative for: Chest pain Gastrointestinal Complaints and Symptoms: Negative  for: Frequent diarrhea; Nausea; Vomiting Endocrine Complaints and Symptoms: Negative for: Heat/cold intolerance Genitourinary Complaints and Symptoms: Negative for: Frequent urination Integumentary (Skin) Complaints and Symptoms: Positive for: Wounds - Right anterior lower leg Medical History: Negative for: History of  Burn Musculoskeletal Complaints and Symptoms: Negative for: Muscle Pain; Muscle Weakness Medical History: Past Medical History Notes: Heel Fracture Neurologic Complaints and Symptoms: Negative for: Numbness/parasthesias Psychiatric Complaints and Symptoms: Negative for: Claustrophobia; Suicidal Hematologic/Lymphatic Immunological Oncologic Immunizations Pneumococcal Vaccine: Received Pneumococcal Vaccination: No Implantable Devices None Hospitalization / Surgery History Type of Hospitalization/Surgery Tooth extractions Family and Social History Current some day smoker - 1 pack per day - started on 11/07/1991; Marital Status - Divorced; Alcohol Use: Rarely; Drug Use: No History; Caffeine Use: Rarely; Financial Concerns: No; Food, Clothing or Shelter Needs: No; Support System Lacking: No; Transportation Concerns: No Psychologist, prison and probation serviceslectronic Signature(s) Signed: 03/05/2021 9:38:57 AM By: Duanne Guessannon, Jahquan Klugh MD FACS Signed: 03/05/2021 5:29:26 PM By: Karie SchwalbeScotton, Joanne RN Previous Signature: 03/02/2021 2:47:11 PM Version By: Karl BalesGrant, Charlie EMT Entered By: Karie SchwalbeScotton, Joanne on 03/05/2021 08:21:28 -------------------------------------------------------------------------------- SuperBill Details Patient Name: Date of Service: [name] [name], [name] [name] 03/05/2021 Medical Record Number: [id number] Patient Account Number: [id number] Date of Birth/Sex: Treating RN: 06/29/1970 (51 y.o. 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 Facility Procedures CPT4 Code: [id number] Description: 99213 - WOUND CARE VISIT-LEV 3 EST PT Modifier: 25 Quantity: 1 CPT4 Code: [id number] Description: 11042 - DEB SUBQ TISSUE 20 SQ CM/< ICD-10 Diagnosis Description L97.813  Non-pressure chronic ulcer of other part of right lower leg with necrosis of mu Modifier: scle Quantity: 1 Physician Procedures : CPT4 Code Description Modifier [id number] 99205 - WC PHYS LEVEL 5 - NEW PT 25 ICD-10 Diagnosis Description L97.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 Quantity: 1 : [id number] 11042 - WC PHYS SUBQ TISS 20 SQ CM ICD-10 Diagnosis Description L97.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

## 2021-03-07 ENCOUNTER — Other Ambulatory Visit: Payer: Self-pay

## 2021-03-07 ENCOUNTER — Encounter (HOSPITAL_BASED_OUTPATIENT_CLINIC_OR_DEPARTMENT_OTHER): Payer: Self-pay | Attending: General Surgery | Admitting: General Surgery

## 2021-03-07 DIAGNOSIS — L97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: Secondary | ICD-10-CM | POA: Insufficient documentation

## 2021-03-08 NOTE — Progress Notes (Signed)
[name], [name] ([id number]) ?Visit Report for 03/07/2021 ?SuperBill Details ?Patient Name: Date of Service: ?[name] [name], [name] [name] 03/07/2021 ?Medical Record Number: [id number] ?Patient Account Number: [id number] ?Date of Birth/Sex: Treating RN: ?05-27-1970 (51 y.o. 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

## 2021-03-08 NOTE — Progress Notes (Signed)
[name], [name] ([id number]) Visit Report for 03/07/2021 Arrival Information Details Patient Name: Date of Service: [name] [name], [name] [name] 03/07/2021 11:00 A M Medical Record Number: [id number] Patient Account Number: [id number] Date of Birth/Sex: Treating RN: [birth year] (51 y.o. M) Primary Care Leotis Isham: PA Zenovia Jordan, NO Other Clinician: Referring Leni Pankonin: Treating Farhaan Mabee/Extender: Edwena Bunde Weeks in Treatment: 0 Visit Information History Since Last Visit Added or deleted any medications: No Patient Arrived: Ambulatory Any new allergies or adverse reactions: No Arrival Time: 11:10 Had a fall or experienced change in No Accompanied By: self activities of daily living that may affect Transfer Assistance: None risk of falls: Patient Identification Verified: Yes Signs or symptoms of abuse/neglect since last visito No Secondary Verification Process Completed: Yes Hospitalized since last visit: No Patient Has Alerts: No Implantable device outside of the clinic excluding No cellular tissue based products placed in the center since last visit: Has Dressing in Place as Prescribed: Yes Pain Present Now: Yes Electronic Signature(s) Signed: 03/08/2021 9:21:53 AM By: Karl Ito Entered By: Karl Ito on 03/07/2021 11:10:22 -------------------------------------------------------------------------------- Clinic Level of Care Assessment Details Patient Name: Date of Service: [name] [name] 03/07/2021 11:00 A M Medical Record Number: [id number] Patient Account Number: [id number] Date of Birth/Sex: Treating RN: Dec 05, 1970 (51 y.o. Cline Cools Primary Care Caralyn Twining: PA Zenovia Jordan, [place] Other Clinician: Referring Anuar Walgren: Treating [place] Landgrebe/Extender: Edwena Bunde Weeks in Treatment: 0 Clinic Level of Care Assessment Items TOOL 4 Quantity Score X- 1 0 Use when only an EandM is performed on FOLLOW-UP visit ASSESSMENTS - Nursing Assessment /  Reassessment X- 1 10 Reassessment of Co-morbidities (includes updates in patient status) X- 1 5 Reassessment of Adherence to Treatment Plan ASSESSMENTS - Wound and Skin A ssessment / Reassessment X - Simple Wound Assessment / Reassessment - one wound 1 5 []  - 0 Complex Wound Assessment / Reassessment - multiple wounds []  - 0 Dermatologic / Skin Assessment (not related to wound area) ASSESSMENTS - Focused Assessment []  - 0 Circumferential Edema Measurements - multi extremities []  - 0 Nutritional Assessment / Counseling / Intervention []  - 0 Lower Extremity Assessment (monofilament, tuning fork, pulses) []  - 0 Peripheral Arterial Disease Assessment (using hand held doppler) ASSESSMENTS - Ostomy and/or Continence Assessment and Care []  - 0 Incontinence Assessment and Management []  - 0 Ostomy Care Assessment and Management (repouching, etc.) PROCESS - Coordination of Care X - Simple Patient / Family Education for ongoing care 1 15 []  - 0 Complex (extensive) Patient / Family Education for ongoing care []  - 0 Staff obtains Chiropractor, Records, T Results / Process Orders est []  - 0 Staff telephones HHA, Nursing Homes / Clarify orders / etc []  - 0 Routine Transfer to another Facility (non-emergent condition) []  - 0 Routine Hospital Admission (non-emergent condition) []  - 0 New Admissions / Manufacturing engineer / Ordering NPWT Apligraf, etc. , []  - 0 Emergency Hospital Admission (emergent condition) X- 1 10 Simple Discharge Coordination []  - 0 Complex (extensive) Discharge Coordination PROCESS - Special Needs []  - 0 Pediatric / Minor Patient Management []  - 0 Isolation Patient Management []  - 0 Hearing / Language / Visual special needs []  - 0 Assessment of Community assistance (transportation, D/C planning, etc.) []  - 0 Additional assistance / Altered mentation []  - 0 Support Surface(s) Assessment (bed, cushion, seat, etc.) INTERVENTIONS - Wound Cleansing /  Measurement X - Simple Wound Cleansing - one wound 1 5 []  - 0 Complex Wound Cleansing - multiple wounds []  - 0 Wound Imaging (photographs -  any number of wounds) []  - 0 Wound Tracing (instead of photographs) []  - 0 Simple Wound Measurement - one wound []  - 0 Complex Wound Measurement - multiple wounds INTERVENTIONS - Wound Dressings []  - 0 Small Wound Dressing one or multiple wounds X- 1 15 Medium Wound Dressing one or multiple wounds []  - 0 Large Wound Dressing one or multiple wounds X- 1 5 Application of Medications - topical []  - 0 Application of Medications - injection INTERVENTIONS - Miscellaneous []  - 0 External ear exam []  - 0 Specimen Collection (cultures, biopsies, blood, body fluids, etc.) []  - 0 Specimen(s) / Culture(s) sent or taken to Lab for analysis []  - 0 Patient Transfer (multiple staff / / Similar devices) []  - 0 Simple Staple / Suture removal (25 or less) []  - 0 Complex Staple / Suture removal (26 or more) []  - 0 Hypo / Hyperglycemic Management (close monitor of Blood Glucose) []  - 0 Ankle / Brachial Index (ABI) - do not check if billed separately X- 1 5 Vital Signs Has the patient been seen at the hospital within the last three years: Yes Total Score: 75 Level Of Care: New/Established - Level 2 Electronic Signature(s) Signed: 03/08/2021 5:02:38 PM By: RN, BSN Entered By: on 03/07/2021 17:10:54 -------------------------------------------------------------------------------- Encounter Discharge Information Details Patient Name: Date of Service: [name] 03/07/2021 11:00 A M Medical Record Number: Patient Account Number: Date of Birth/Sex: Treating RN: Dec 11, 1970 (51 y.o. Primary Care Purcell Jungbluth: PA , Other Clinician: Referring Chantalle Defilippo: Treating Zaryan Yakubov/Extender: Weeks in Treatment: 0 Encounter Discharge  Information Items Discharge Condition: Stable Ambulatory Status: Ambulatory Discharge Destination: Home Transportation: Private Auto Accompanied By: self Schedule Follow-up Appointment: Yes Clinical Summary of Care: Patient Declined Electronic Signature(s) Signed: 03/08/2021 5:02:38 PM By: Redmond Pulling RN, BSN Entered By: Redmond Pulling on 03/07/2021 16:30:45 -------------------------------------------------------------------------------- Patient/Caregiver Education Details Patient Name: Date of Service: Tresa Endo [name] 3/1/2023andnbsp11:00 A M Medical Record Number: [id number] Patient Account Number: [id number] Date of Birth/Gender: Treating RN: 1970/05/16 (51 y.o. Cline Cools Primary Care Physician: PA Zenovia Jordan, [place] Other Clinician: Referring Physician: Treating Physician/Extender: Edwena Bunde in Treatment: 0 Education Assessment Education Provided To: Patient Education Topics Provided Wound/Skin Impairment: Methods: Explain/Verbal Responses: Return demonstration correctly Electronic Signature(s) Signed: 03/08/2021 5:02:38 PM By: Redmond Pulling RN, BSN Entered By: Redmond Pulling on 03/07/2021 16:30:17 -------------------------------------------------------------------------------- Wound Assessment Details Patient Name: Date of Service: SHAFT, CORIGLIANO [name] 03/07/2021 11:00 A M Medical Record Number: [id number] Patient Account Number: [id number] Date of Birth/Sex: Treating RN: 12-Dec-1970 (51 y.o. M) Primary Care Dovie Kapusta: PA TIENT, NO Other Clinician: Referring Kartik Fernando: Treating Piper Hassebrock/Extender: Cline Cools Weeks in Treatment: 0 Wound Status Wound Number: 1 Primary Etiology: Trauma, Other Wound Location: Right, Medial, Anterior Lower Leg Wound Status: Open Wounding Event: Trauma Date Acquired: 01/19/2021 Weeks Of Treatment: 0 Clustered Wound: No Wound Measurements Length: (cm) 7.5 Width: (cm) 2.4 Depth:  (cm) 0.7 Area: (cm) 14.137 Volume: (cm) 9.896 % Reduction in Area: 0% % Reduction in Volume: 0% Wound Description Classification: Full Thickness Without Exposed Support Structur Exudate Amount: Medium Exudate Type: Serosanguineous Exudate Color: red, brown es Treatment Notes Wound #1 (Lower Leg) Wound Laterality: Right, Medial, Anterior Cleanser Soap and Water Discharge Instruction: May shower and wash wound with dial antibacterial soap and water prior to dressing change. Wound Cleanser Discharge Instruction: Cleanse the wound with wound cleanser prior to applying a clean dressing using  gauze sponges, not tissue or cotton balls. Peri-Wound Care Topical Primary Dressing Iodosorb Gel 10 (gm) Tube Discharge Instruction: Apply to wound bed as instructed Gentamicin Discharge Instruction: place into wound bed Secondary Dressing Woven Gauze Sponge, Non-Sterile 4x4 in Discharge Instruction: Apply over primary dressing as directed. Secured With [employer], 4.5x3.1 (in/yd) Discharge Instruction: Secure with Kerlix as directed. 16M Medipore H Soft Cloth Surgical T ape, 4 x 10 (in/yd) Discharge Instruction: Secure with tape as directed. Compression Wrap Compression Stockings Add-Ons Electronic Signature(s) Signed: 03/08/2021 9:21:53 AM By: Karl Ito Entered By: Karl Ito on 03/07/2021 11:10:48 -------------------------------------------------------------------------------- Vitals Details Patient Name: Date of Service: Tresa Endo [name] 03/07/2021 11:00 A M Medical Record Number: [id number] Patient Account Number: [id number] Date of Birth/Sex: Treating RN: 09/24/1970 (51 y.o. M) Primary Care Amylah Will: PA Zenovia Jordan, NO Other Clinician: Referring Ritchie Klee: Treating Nilam Quakenbush/Extender: Edwena Bunde Weeks in Treatment: 0 Vital Signs Time Taken: 11:10 Temperature (F): 97.5 Height (in): 71 Pulse (bpm): 82 Weight (lbs): 165 Respiratory  Rate (breaths/min): 18 Body Mass Index (BMI): 23 Blood Pressure (mmHg): 165/89 Reference Range: 80 - 120 mg / dl Electronic Signature(s) Signed: 03/08/2021 9:21:53 AM By: Karl Ito Entered By: Karl Ito on 03/07/2021 11:10:38

## 2021-03-09 ENCOUNTER — Other Ambulatory Visit: Payer: Self-pay

## 2021-03-09 ENCOUNTER — Encounter (HOSPITAL_BASED_OUTPATIENT_CLINIC_OR_DEPARTMENT_OTHER): Payer: Self-pay | Admitting: General Surgery

## 2021-03-11 LAB — AEROBIC/ANAEROBIC CULTURE W GRAM STAIN (SURGICAL/DEEP WOUND): Gram Stain: NONE SEEN

## 2021-03-12 NOTE — Progress Notes (Signed)
[name] [name], [name] [name] ([id number]) Visit Report for 03/09/2021 Chief Complaint Document Details Patient Name: Date of Service: [name] [name], [name] [name] 03/09/2021 8:45 A M Medical Record Number: [id number] Patient Account Number: [id number] Date of Birth/Sex: Treating RN: 09/13/1970 (51 y.o. M) Primary Care Provider: PA Zenovia [name], NO Other Clinician: Referring Provider: Treating Provider/Extender: [name] [name], Anthem Frazer [name], [name] [name] in Treatment: 0 Information Obtained from: Patient Chief Complaint ADMISSION: 03/09/2021: Patient seen for complaints of Non-Healing Wound. Electronic Signature(s) Signed: 03/09/2021 9:11:42 AM By: [name] [name], Sonjia Wilcoxson MD [name] Entered By: [name] [name], Adalena Abdulla on 03/09/2021 09:11:42 -------------------------------------------------------------------------------- Debridement Details Patient Name: Date of Service: [name] [name], [name] [name] 03/09/2021 8:45 A M Medical Record Number: [id number] Patient Account Number: [id number] Date of Birth/Sex: Treating RN: 07/02/1970 (51 y.o. Elizebeth KollerM) [name] [name] Primary Care Provider: PA Zenovia [name], NO Other Clinician: Referring Provider: Treating Provider/Extender: [name] [name], [name] [name], [name] [name] in Treatment: 0 Debridement Performed for Assessment: Wound #1 Right,Medial,Anterior Lower Leg Performed By: Physician [name] [name], Fabyan Loughmiller, MD Debridement Type: Debridement Level of Consciousness (Pre-procedure): Awake and Alert Pre-procedure Verification/Time Out Yes - 08:59 Taken: Start Time: 08:59 T Area Debrided (L x W): otal 7.3 (cm) x 2.2 (cm) = 16.06 (cm) Tissue and other material debrided: Non-Viable, Slough, Biofilm, Slough Level: Non-Viable Tissue Debridement Description: Selective/Open Wound Instrument: Curette Bleeding: Minimum Hemostasis Achieved: Pressure End Time: 09:02 Procedural Pain: 3 Post Procedural Pain: 2 Response to Treatment: Procedure was tolerated well Level of Consciousness (Post- Awake and Alert procedure): Post  Debridement Measurements of Total Wound Length: (cm) 7.3 Width: (cm) 2.2 Depth: (cm) 0.7 Volume: (cm) 8.829 Character of Wound/Ulcer Post Debridement: Requires Further Debridement Post Procedure Diagnosis Same as Pre-procedure Electronic Signature(s) Signed: 03/09/2021 9:27:06 AM By: [name] [name], Chevis Weisensel MD [name] Signed: 03/12/2021 5:43:07 PM By: [name] [name] Entered By: [name] [name], [name] on 03/09/2021 09:00:46 -------------------------------------------------------------------------------- HPI Details Patient Name: Date of Service: [name] [name], [name] [name] 03/09/2021 8:45 A M Medical Record Number: [id number] Patient Account Number: [id number] Date of Birth/Sex: Treating RN: 10/10/1970 (51 y.o. M) Primary Care Provider: PA Zenovia [name], NO Other Clinician: Referring Provider: Treating Provider/Extender: [name] [name], Samiha Denapoli [name], [name] [name] in Treatment: 0 History of Present Illness HPI Description: ADMISSION 03/05/2021: This is a 51 year old man who was injured at work when a metal bar struck his right lower leg. He presented to the emergency department at [facility] where he underwent radiographic imaging that did not show a fracture. The laceration was sutured and the patient was discharged from the emergency room. He returned to the emergency room to [name] later with separation of his wound. At that time, no additional studies were performed, but he was placed on a course of antibiotics (doxycycline and Keflex). He completed those about a week ago. He has continued to have significant pain as well as drainage from the site. He has been applying triple antibiotic ointment and a nonstick gauze to the site without significant improvement. He has not experienced any fevers or chills. There is some drainage, but significantly less than prior, per his report. He is accompanied by his son today. He is otherwise healthy without any significant medical history. He takes no prescribed home  medications. CLINICAL DATA: Laceration, hit with metal bar EXAM: RIGHT TIBIA AND FIBULA - 2 VIEW COMPARISON: 06/26/2018 FINDINGS: Frontal and lateral views of the right tibia and fibula are obtained. No acute displaced fracture. Right knee and ankle are well aligned. Mild soft tissue swelling involving the anterolateral right lower leg. No radiopaque foreign body. IMPRESSION: 1. Mild soft tissue swelling. No fracture or  radiopaque foreign body. No labs were performed at either emergency department evaluation. 03/09/2021: The wound is slightly smaller today. He still has some tenderness associated with it. Still with some drainage. Culture taken on Monday returned with a very small amount of Escherichia coli, sensitive to gentamicin. He is currently getting topical gentamicin ointment with Iodoflex. He is not in compression. Electronic Signature(s) Signed: 03/09/2021 9:12:48 AM By: [name] [name] Entered By: [name] Guess on 03/09/2021 09:12:48 -------------------------------------------------------------------------------- Physical Exam Details Patient Name: Date of Service: [name] [name] 03/09/2021 8:45 A M Medical Record Number: [id number] Patient Account Number: [id number] Date of Birth/Sex: Treating RN: Oct 12, 1970 (51 y.o. M) Primary Care Provider: PA Zenovia Jordan, NO Other Clinician: Referring Provider: Treating Provider/Extender: [name] Bunde [name] in Treatment: 0 Constitutional Slightly hypertensive. . . No acute distress. Respiratory Normal work of breathing on room air. Notes 03/09/2021: Wound evaluationthe vertical wound along the anterior tibial surface is slightly smaller today. The tunneling is also decreased. The tibia remains exposed. The odor is gone. Still with some fibrinous slough, but improved. Granulation tissue is beginning to form. Electronic Signature(s) Signed: 03/09/2021 9:14:26 AM By: [name] [name] Entered By: [name] Guess on 03/09/2021 09:14:26 -------------------------------------------------------------------------------- Physician Orders Details Patient Name: Date of Service: AMARE, BAIL [name] 03/09/2021 8:45 A M Medical Record Number: [id number] Patient Account Number: [id number] Date of Birth/Sex: Treating RN: 1970/06/07 (51 y.o. 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 - Dr. Lady Gary Return A Bathing/ Shower/ Hygiene May shower with protection but do not get wound dressing(s) wet. - Ok to use Chartered loss adjuster, can purchase at [facility], [facility], or [employer] Edema Control - Lymphedema / SCD / Other Right Lower Extremity Elevate legs to the level of the heart or above for 30 minutes daily and/or when sitting, a frequency of: - Throughout the day Avoid standing for long periods of time. Wound Treatment Wound #1 - Lower Leg Wound Laterality: Right, Medial, Anterior Cleanser: Soap and Water 1 x Per Week/30 Days Discharge Instructions: May shower and wash wound with dial antibacterial soap and water prior to dressing change. Cleanser: Wound Cleanser 1 x Per Week/30 Days Discharge Instructions: Cleanse the wound with wound cleanser prior to applying a clean dressing using gauze sponges, not tissue or cotton balls. Peri-Wound Care: Triamcinolone 15 (g) 1 x Per Week/30 Days Discharge Instructions: Use triamcinolone 15 (g) as directed Peri-Wound Care: Zinc Oxide Ointment 30g tube 1 x Per Week/30 Days Discharge Instructions: Apply Zinc Oxide to periwound with each dressing change Peri-Wound Care: Sween Lotion (Moisturizing lotion) 1 x Per Week/30 Days Discharge  Instructions: Apply moisturizing lotion as directed Topical: Gentamicin 1 x Per Week/30 Days Discharge Instructions: As directed by physician Prim Dressing: Iodosorb Gel 10 (gm) Tube 1 x Per Week/30 Days ary Discharge Instructions: Apply to wound bed as instructed Secondary Dressing: Zetuvit Plus 4x8 in 1 x Per Week/30 Days Discharge Instructions: Apply over primary dressing as directed. Secured With: [employer], 4.5x3.1 (in/yd) 1 x Per Week/30 Days Discharge Instructions: Secure with Kerlix as directed. Secured With: 66M Medipore H Soft Cloth Surgical T ape, 4 x 10 (in/yd) 1 x Per Week/30 Days Discharge Instructions: Secure with tape as directed. Compression Wrap: Kerlix  Roll 4.5x3.1 (in/yd) 1 x Per Week/30 Days Discharge Instructions: Apply Kerlix and Coban compression as directed. Compression Wrap: Coban Self-Adherent Wrap 4x5 (in/yd) 1 x Per Week/30 Days Discharge Instructions: Apply over Kerlix as directed. Electronic Signature(s) Signed: 03/09/2021 9:27:06 AM By: [name] [name], Wynell Halberg MD [name] Entered By: [name] [name], Levi Klaiber on 03/09/2021 09:14:59 -------------------------------------------------------------------------------- Problem List Details Patient Name: Date of Service: [name] [name], [name] [name] 03/09/2021 8:45 A M Medical Record Number: [id number] Patient Account Number: [id number] Date of Birth/Sex: Treating RN: 03/10/1970 (51 y.o. Elizebeth KollerM) [name] [name] Primary Care Provider: PA Zenovia [name], NO Other Clinician: Referring Provider: Treating Provider/Extender: [name] [name], Detria Cummings [name], [name] [name] in Treatment: 0 Active Problems ICD-10 Encounter Code Description Active Date MDM Diagnosis L97.813 Non-pressure chronic ulcer of other part of right lower leg with necrosis of 03/05/2021 No Yes muscle Inactive Problems Resolved Problems Electronic Signature(s) Signed: 03/09/2021 9:11:10 AM By: [name] [name], Chrsitopher Wik MD [name] Entered By: [name] [name], Betsie Peckman on 03/09/2021  09:11:10 -------------------------------------------------------------------------------- Progress Note Details Patient Name: Date of Service: [name] [name], [name] [name] 03/09/2021 8:45 A M Medical Record Number: [id number] Patient Account Number: [id number] Date of Birth/Sex: Treating RN: 08/30/1970 (51 y.o. M) Primary Care Provider: PA Zenovia [name], NO Other Clinician: Referring Provider: Treating Provider/Extender: [name] [name], Mozella Rexrode [name], [name] [name] in Treatment: 0 Subjective Chief Complaint Information obtained from Patient ADMISSION: 03/09/2021: Patient seen for complaints of Non-Healing Wound. History of Present Illness (HPI) ADMISSION 03/05/2021: This is a 51 year old man who was injured at work when a metal bar struck his right lower leg. He [facility] emergency department [facility] where he underwent radiographic imaging that did not show a fracture. The laceration was sutured and the patient was discharged from the emergency room. He returned to the emergency room to [name] later with separation of his wound. At that time, no additional studies were performed, but he was placed on a course of antibiotics (doxycycline and Keflex). He completed those about a week ago. He has continued to have significant pain as well as drainage from the site. He has been applying triple antibiotic ointment and a nonstick gauze to the site without significant improvement. He has not experienced any fevers or chills. There is some drainage, but significantly less than prior, per his report. He is accompanied by his son today. He is otherwise healthy without any significant medical history. He takes no prescribed home medications. CLINICAL DATA: Laceration, hit with metal bar EXAM: RIGHT TIBIA AND FIBULA - 2 VIEW COMPARISON: 06/26/2018 FINDINGS: Frontal and lateral views of the right tibia and fibula are obtained. No acute displaced fracture. Right knee and ankle are well aligned. Mild  soft tissue swelling involving the anterolateral right lower leg. No radiopaque foreign body. IMPRESSION: 1. Mild soft tissue swelling. No fracture or radiopaque foreign body. No labs were performed at either emergency department evaluation. 03/09/2021: The wound is slightly smaller today. He still has some tenderness associated with it. Still with some drainage. Culture taken on Monday returned with a very small amount of Escherichia coli, sensitive to gentamicin. He is currently getting topical gentamicin ointment with Iodoflex. He is not in compression. Patient History Information obtained from Patient. Social History Current some day smoker - 1 pack per day - started on 11/07/1991, Marital Status - Divorced, Alcohol Use - Rarely, Drug Use - No History, Caffeine Use - Rarely. Medical History Integumentary (Skin) Denies history of History of Burn Hospitalization/Surgery History - Tooth extractions. Medical A Surgical History Notes nd Musculoskeletal Heel Fracture Objective Constitutional Slightly hypertensive. No acute distress.  Vitals Time Taken: 8:40 AM, Height: 71 in, Weight: 165 lbs, BMI: 23, Temperature: 98.5 F, Pulse: 87 bpm, Respiratory Rate: 18 breaths/min, Blood Pressure: 152/83 mmHg. Respiratory Normal work of breathing on room air. General Notes: 03/09/2021: Wound evaluationoothe vertical wound along the anterior tibial surface is slightly smaller today. The tunneling is also decreased. The tibia remains exposed. The odor is gone. Still with some fibrinous slough, but improved. Granulation tissue is beginning to form. Integumentary (Hair, Skin) Wound #1 status is Open. Original cause of wound was Trauma. The date acquired was: 01/19/2021. The wound is located on the Right,Medial,Anterior Lower Leg. The wound measures 7.3cm length x 2.2cm width x 0.7cm depth; 12.613cm^2 area and 8.829cm^3 volume. There is bone and Fat Layer (Subcutaneous Tissue) exposed. There is no  undermining noted, however, there is tunneling at 1:00 with a maximum distance of 2.2cm. There is a medium amount of serosanguineous drainage noted. The wound margin is well defined and not attached to the wound base. There is small (1-33%) red granulation within the wound bed. There is a large (67-100%) amount of necrotic tissue within the wound bed including Adherent Slough. 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 There was a Selective/Open Wound Non-Viable Tissue Debridement with a total area of 16.06 sq cm performed by [name] Guess, MD. With the following instrument(s): Curette to remove Non-Viable tissue/material. Material removed includes Slough and Biofilm and. No specimens were taken. A time out was conducted at 08:59, prior to the start of the procedure. A Minimum amount of bleeding was controlled with Pressure. The procedure was tolerated well with a pain level of 3 throughout and a pain level of 2 following the procedure. Post Debridement Measurements: 7.3cm length x 2.2cm width x 0.7cm depth; 8.829cm^3 volume. Character of Wound/Ulcer Post Debridement requires further debridement. Post procedure Diagnosis Wound #1: Same as Pre-Procedure Plan Follow-up Appointments: Return Appointment in 1 week. - Dr. Lady Gary Bathing/ Shower/ Hygiene: May shower with protection but do not get wound dressing(s) wet. - Ok to use Chartered loss adjuster, can purchase at [facility], [facility], or [employer] Edema Control - Lymphedema / SCD / Other: Elevate legs to the level of the heart or above for 30 minutes daily and/or when sitting, a frequency of: - Throughout the day Avoid standing for long periods of time. WOUND #1: - Lower Leg Wound Laterality: Right, Medial, Anterior Cleanser: Soap and Water 1 x Per Week/30  Days Discharge Instructions: May shower and wash wound with dial antibacterial soap and water prior to dressing change. Cleanser: Wound Cleanser 1 x Per Week/30 Days Discharge Instructions: Cleanse the wound with wound cleanser prior to applying a clean dressing using gauze sponges, not tissue or cotton balls. Peri-Wound Care: Triamcinolone 15 (g) 1 x Per Week/30 Days Discharge Instructions: Use triamcinolone 15 (g) as directed Peri-Wound Care: Zinc Oxide Ointment 30g tube 1 x Per Week/30 Days Discharge Instructions: Apply Zinc Oxide to periwound with each dressing change Peri-Wound Care: Sween Lotion (Moisturizing lotion) 1 x Per Week/30 Days Discharge Instructions: Apply moisturizing lotion as directed Topical: Gentamicin 1 x Per Week/30 Days Discharge Instructions: As directed by physician Prim Dressing: Iodosorb Gel 10 (gm) Tube 1 x Per Week/30 Days ary Discharge Instructions: Apply to wound bed as instructed Secondary Dressing: Zetuvit Plus 4x8 in 1 x Per Week/30 Days Discharge Instructions: Apply over primary  dressing as directed. Secured With: [employer], 4.5x3.1 (in/yd) 1 x Per Week/30 Days Discharge Instructions: Secure with Kerlix as directed. Secured With: 47M Medipore H Soft Cloth Surgical T ape, 4 x 10 (in/yd) 1 x Per Week/30 Days Discharge Instructions: Secure with tape as directed. Com pression Wrap: Kerlix Roll 4.5x3.1 (in/yd) 1 x Per Week/30 Days Discharge Instructions: Apply Kerlix and Coban compression as directed. Com pression Wrap: Coban Self-Adherent Wrap 4x5 (in/yd) 1 x Per Week/30 Days Discharge Instructions: Apply over Kerlix as directed. 03/09/2021: Wound evaluationoothe vertical wound along the anterior tibial surface is slightly smaller today. The tunneling is also decreased. The tibia remains exposed. The odor is gone. Still with some fibrinous slough, but improved. Granulation tissue is beginning to form. I was able to remove more of the slough today  with a curette. Continue Iodoflex and topical gentamicin. We will use light compression (Kerlix and Coban). No need for systemic antibiotics based upon culture data. Topical triamcinolone to the periwound area to help with inflammation and itching. I will see him in a week. Electronic Signature(s) Signed: 03/09/2021 9:16:41 AM By: [name] [name] Entered By: [name] Guess on 03/09/2021 09:16:41 -------------------------------------------------------------------------------- HxROS Details Patient Name: Date of Service: [name] Endo [name] 03/09/2021 8:45 A M Medical Record Number: [id number] Patient Account Number: [id number] Date of Birth/Sex: Treating RN: Feb 14, 1970 (51 y.o. M) Primary Care Provider: PA Zenovia Jordan, NO Other Clinician: Referring Provider: Treating Provider/Extender: [name] Bunde [name] in Treatment: 0 Information Obtained From Patient Integumentary (Skin) Medical History: Negative for: History of Burn Musculoskeletal Medical History: Past Medical History Notes: Heel Fracture Immunizations Pneumococcal Vaccine: Received Pneumococcal Vaccination: No Implantable Devices None Hospitalization / Surgery History Type of Hospitalization/Surgery Tooth extractions Family and Social History Current some day smoker - 1 pack per day - started on 11/07/1991; Marital Status - Divorced; Alcohol Use: Rarely; Drug Use: No History; Caffeine Use: Rarely; Financial Concerns: No; Food, Clothing or Shelter Needs: No; Support System Lacking: No; Transportation Concerns: No Physician Affirmation I have reviewed and agree with the above information. Electronic Signature(s) Signed: 03/09/2021 9:27:06 AM By: [name] [name] Entered By: [name] Guess on 03/09/2021 09:12:56 -------------------------------------------------------------------------------- SuperBill Details Patient Name: Date of Service: ELIJA, MCCAMISH [name] 03/09/2021 Medical Record Number:  [id number] Patient Account Number: [id number] Date of Birth/Sex: Treating RN: 1970/10/24 (51 y.o. 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 Facility Procedures CPT4 Code: [id number] Description: [phone] - DEBRIDE WOUND 1ST 20 SQ CM OR < ICD-10 Diagnosis Description L97.813 Non-pressure chronic ulcer of other part of right lower leg with necrosis of mus Modifier: cle Quantity: 1 Physician Procedures : CPT4 Code Description Modifier [phone] [id number] - WC PHYS DEBR WO ANESTH 20 SQ CM ICD-10 Diagnosis Description 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

## 2021-03-12 NOTE — Progress Notes (Signed)
[name] [name], [name] [name] ([id number]) Visit Report for 03/09/2021 Arrival Information Details Patient Name: Date of Service: [name] [name], [name] [name] RD 03/09/2021 8:45 A M Medical Record Number: [id number] Patient Account Number: [id number] Date of Birth/Sex: Treating RN: 1970/09/24 (51 y.o. M) Primary Care [name] [name]: PA [name] [name], NO Other Clinician: Referring [name] [name]: Treating [name] [name]/Extender: [name] [name] Weeks in Treatment: 0 Visit Information History Since Last Visit Added or deleted any medications: No Patient Arrived: Ambulatory Any new allergies or adverse reactions: No Arrival Time: 08:40 Had a fall or experienced change in No Accompanied By: son activities of daily living that may affect Transfer Assistance: None risk of falls: Patient Identification Verified: Yes Signs or symptoms of abuse/neglect since last visito No Secondary Verification Process Completed: Yes Hospitalized since last visit: No Patient Has Alerts: No Implantable device outside of the clinic excluding No cellular tissue based products placed in the center since last visit: Has Dressing in Place as Prescribed: Yes Pain Present Now: No Electronic Signature(s) Signed: 03/09/2021 1:10:25 PM By: [name] [name] Entered By: [name] [name] on 03/09/2021 08:40:52 -------------------------------------------------------------------------------- Encounter Discharge Information Details Patient Name: Date of Service: [name] [name] RD 03/09/2021 8:45 A M Medical Record Number: [id number] Patient Account Number: [id number] Date of Birth/Sex: Treating RN: 18-Aug-1970 (51 y.o. [name] Primary Care Julisa Flippo: PA [name] [name], NO Other Clinician: Referring [name] [name]: Treating Schwanda Zima/Extender: [name] [name] Weeks in Treatment: 0 Encounter Discharge Information Items Post Procedure Vitals Discharge Condition: Stable Temperature (F): 98.5 Ambulatory Status: Ambulatory Pulse (bpm):  87 Discharge Destination: Home Respiratory Rate (breaths/min): 18 Transportation: Private Auto Blood Pressure (mmHg): 152/83 Accompanied By: son Schedule Follow-up Appointment: Yes Clinical Summary of Care: Patient Declined Electronic Signature(s) Signed: 03/12/2021 5:43:07 PM By: Zandra Abts RN, BSN Entered By: Zandra Abts on 03/09/2021 15:34:50 -------------------------------------------------------------------------------- Lower Extremity Assessment Details Patient Name: Date of Service: [name] [name], [name] [name] RD 03/09/2021 8:45 A M Medical Record Number: [id number] Patient Account Number: [id number] Date of Birth/Sex: Treating RN: [birth year] (51 y.o. [name] Primary Care Jalyn Dutta: PA [name] [name], NO Other Clinician: Referring Verlie Hellenbrand: Treating Cashmere Harmes/Extender: [name] [name] Weeks in Treatment: 0 Edema Assessment Assessed: [Left: No] [Right: No] E[Left: dema] [Right: :] Calf Left: Right: Point of Measurement: 33 cm From Medial Instep 32 cm Ankle Left: Right: Point of Measurement: 10 cm From Medial Instep 19 cm Vascular Assessment Pulses: Dorsalis Pedis Palpable: [Right:Yes] Electronic Signature(s) Signed: 03/12/2021 5:43:07 PM By: Zandra Abts RN, BSN Entered By: Zandra Abts on 03/09/2021 08:54:24 -------------------------------------------------------------------------------- Multi Wound Chart Details Patient Name: Date of Service: [name] [name] RD 03/09/2021 8:45 A M Medical Record Number: [id number] Patient Account Number: [id number] Date of Birth/Sex: Treating RN: August 04, 1970 (51 y.o. M) Primary Care Thersa Mohiuddin: PA [name] [name], NO Other Clinician: Referring Bary Limbach: Treating Rashmi Tallent/Extender: [name] [name] Weeks in Treatment: 0 Vital Signs Height(in): 71 Pulse(bpm): 87 Weight(lbs): 165 Blood Pressure(mmHg): 152/83 Body Mass Index(BMI): 23 Temperature(F): 98.5 Respiratory Rate(breaths/min): 18 Photos:  [1:Right, Medial, Anterior Lower Leg] [N/A:N/A N/A] Wound Location: [1:Trauma] [N/A:N/A] Wounding Event: [1:Trauma, Other] [N/A:N/A] Primary Etiology: [1:01/19/2021] [N/A:N/A] Date Acquired: [1:0] [N/A:N/A] Weeks of Treatment: [1:Open] [N/A:N/A] Wound Status: [1:No] [N/A:N/A] Wound Recurrence: [1:7.3x2.2x0.7] [N/A:N/A] Measurements L x W x D (cm) [1:12.613] [N/A:N/A] A (cm) : rea [1:8.829] [N/A:N/A] Volume (cm) : [1:10.80%] [N/A:N/A] % Reduction in A rea: [1:10.80%] [N/A:N/A] % Reduction in Volume: [1:1] Position 1 (o'clock): [1:2.2] Maximum Distance 1 (cm): [1:Yes] [N/A:N/A] Tunneling: [1:Full Thickness With Exposed Support N/A] Classification: [1:Structures Medium] [N/A:N/A] Exudate A mount: [1:Serosanguineous] [N/A:N/A] Exudate Type: [1:red, brown] [N/A:N/A]  Exudate Color: [1:Well defined, not attached] [N/A:N/A] Wound Margin: [1:Small (1-33%)] [N/A:N/A] Granulation A mount: [1:Red] [N/A:N/A] Granulation Quality: [1:Large (67-100%)] [N/A:N/A] Necrotic A mount: [1:Fat Layer (Subcutaneous Tissue): Yes N/A] Exposed Structures: [1:Bone: Yes Fascia: No Tendon: No Muscle: No Joint: No Small (1-33%)] [N/A:N/A] Epithelialization: [1:Debridement - Selective/Open Wound N/A] Debridement: Pre-procedure Verification/Time Out 08:59 [N/A:N/A] Taken: [1:Slough] [N/A:N/A] Tissue Debrided: [1:Non-Viable Tissue] [N/A:N/A] Level: [1:16.06] [N/A:N/A] Debridement A (sq cm): [1:rea Curette] [N/A:N/A] Instrument: [1:Minimum] [N/A:N/A] Bleeding: [1:Pressure] [N/A:N/A] Hemostasis A chieved: [1:3] [N/A:N/A] Procedural Pain: [1:2] [N/A:N/A] Post Procedural Pain: [1:Procedure was tolerated well] [N/A:N/A] Debridement Treatment Response: [1:7.3x2.2x0.7] [N/A:N/A] Post Debridement Measurements L x W x D (cm) [1:8.829] [N/A:N/A] Post Debridement Volume: (cm) [1:Debridement] [N/A:N/A] Treatment Notes Electronic Signature(s) Signed: 03/09/2021 9:11:27 AM By: [name] MD FACS Entered By:  [name] on 03/09/2021 09:11:27 -------------------------------------------------------------------------------- Multi-Disciplinary Care Plan Details Patient Name: Date of Service: [name] [name] RD 03/09/2021 8:45 A M Medical Record Number: [id number] Patient Account Number: [id number] Date of Birth/Sex: Treating RN: July 20, 1970 (51 y.o. [name] Primary Care Pennelope Basque: PA [name] [name], NO Other Clinician: Referring Greer Wainright: Treating Ariba Lehnen/Extender: [name] [name] Weeks in Treatment: 0 Active Inactive Pain, Acute or Chronic Nursing Diagnoses: Pain, acute or chronic: actual or potential Goals: Patient will verbalize adequate pain control and receive pain control interventions during procedures as needed Date Initiated: 03/05/2021 Target Resolution Date: 04/02/2021 Goal Status: Active Interventions: Complete pain assessment as per visit requirements Notes: Wound/Skin Impairment Nursing Diagnoses: Impaired tissue integrity Knowledge deficit related to ulceration/compromised skin integrity Goals: Patient/caregiver will verbalize understanding of skin care regimen Date Initiated: 03/05/2021 Target Resolution Date: 04/06/2021 Goal Status: Active Ulcer/skin breakdown will have a volume reduction of 30% by week 4 Date Initiated: 03/05/2021 Target Resolution Date: 04/06/2021 Goal Status: Active Interventions: Assess patient/caregiver ability to obtain necessary supplies Assess patient/caregiver ability to perform ulcer/skin care regimen upon admission and as needed Assess ulceration(s) every visit Provide education on ulcer and skin care Notes: Electronic Signature(s) Signed: 03/12/2021 5:43:07 PM By: Zandra Abts RN, BSN Entered By: Zandra Abts on 03/09/2021 15:33:50 -------------------------------------------------------------------------------- Pain Assessment Details Patient Name: Date of Service: [name] [name] RD 03/09/2021 8:45 A M Medical  Record Number: [id number] Patient Account Number: [id number] Date of Birth/Sex: Treating RN: [birth year] (51 y.o. M) Primary Care Desere Gwin: PA [name] [name], NO Other Clinician: Referring Keashia Haskins: Treating Flemon Kelty/Extender: [name] [name] Weeks in Treatment: 0 Active Problems Location of Pain Severity and Description of Pain Patient Has Paino No Site Locations Pain Management and Medication Current Pain Management: Electronic Signature(s) Signed: 03/09/2021 1:10:25 PM By: [name] [name] Entered By: [name] [name] on 03/09/2021 08:41:28 -------------------------------------------------------------------------------- Patient/Caregiver Education Details Patient Name: Date of Service: [name] [name] RD 3/3/2023andnbsp8:45 A M Medical Record Number: [id number] Patient Account Number: [id number] Date of Birth/Gender: Treating RN: 27-Jul-1970 (51 y.o. [name] Primary Care Physician: PA [name] [name], NO Other Clinician: Referring Physician: Treating Physician/Extender: [name] [name] in Treatment: 0 Education Assessment Education Provided To: Patient Education Topics Provided Wound/Skin Impairment: Methods: Explain/Verbal Responses: State content correctly Electronic Signature(s) Signed: 03/12/2021 5:43:07 PM By: Zandra Abts RN, BSN Entered By: Zandra Abts on 03/09/2021 15:34:00 -------------------------------------------------------------------------------- Wound Assessment Details Patient Name: Date of Service: [name] [name] RD 03/09/2021 8:45 A M Medical Record Number: [id number] Patient Account Number: [id number] Date of Birth/Sex: Treating RN: [birth year] (51 y.o. M) Primary Care Jasiel Apachito: PA [name] [name], NO Other Clinician: Referring Starr Urias: Treating Monseratt Ledin/Extender: [name] [name] Weeks in Treatment: 0 Wound Status Wound Number: 1 Primary Etiology:  Trauma, Other Wound Location: Right,  Medial, Anterior Lower Leg Wound Status: Open Wounding Event: Trauma Date Acquired: 01/19/2021 Weeks Of Treatment: 0 Clustered Wound: No Photos Wound Measurements Length: (cm) 7.3 Width: (cm) 2.2 Depth: (cm) 0.7 Area: (cm) 12.613 Volume: (cm) 8.829 % Reduction in Area: 10.8% % Reduction in Volume: 10.8% Epithelialization: Small (1-33%) Tunneling: Yes Position (o'clock): 1 Maximum Distance: (cm) 2.2 Undermining: No Wound Description Classification: Full Thickness With Exposed Support Structures Wound Margin: Well defined, not attached Exudate Amount: Medium Exudate Type: Serosanguineous Exudate Color: red, brown Foul Odor After Cleansing: No Slough/Fibrino Yes Wound Bed Granulation Amount: Small (1-33%) Exposed Structure Granulation Quality: Red Fascia Exposed: No Necrotic Amount: Large (67-100%) Fat Layer (Subcutaneous Tissue) Exposed: Yes Necrotic Quality: Adherent Slough Tendon Exposed: No Muscle Exposed: No Joint Exposed: No Bone Exposed: Yes Treatment Notes Wound #1 (Lower Leg) Wound Laterality: Right, Medial, Anterior Cleanser Soap and Water Discharge Instruction: May shower and wash wound with dial antibacterial soap and water prior to dressing change. Wound Cleanser Discharge Instruction: Cleanse the wound with wound cleanser prior to applying a clean dressing using gauze sponges, not tissue or cotton balls. Peri-Wound Care Triamcinolone 15 (g) Discharge Instruction: Use triamcinolone 15 (g) as directed Zinc Oxide Ointment 30g tube Discharge Instruction: Apply Zinc Oxide to periwound with each dressing change Sween Lotion (Moisturizing lotion) Discharge Instruction: Apply moisturizing lotion as directed Topical Gentamicin Discharge Instruction: As directed by physician Primary Dressing Iodosorb Gel 10 (gm) Tube Discharge Instruction: Apply to wound bed as instructed Secondary Dressing Zetuvit Plus 4x8 in Discharge Instruction: Apply over primary  dressing as directed. Secured With [employer], 4.5x3.1 (in/yd) Discharge Instruction: Secure with Kerlix as directed. 53M Medipore H Soft Cloth Surgical T ape, 4 x 10 (in/yd) Discharge Instruction: Secure with tape as directed. Compression Wrap Kerlix Roll 4.5x3.1 (in/yd) Discharge Instruction: Apply Kerlix and Coban compression as directed. Coban Self-Adherent Wrap 4x5 (in/yd) Discharge Instruction: Apply over Kerlix as directed. Compression Stockings Add-Ons Electronic Signature(s) Signed: 03/12/2021 5:43:07 PM By: Zandra Abts RN, BSN Entered By: Zandra Abts on 03/09/2021 09:01:25 -------------------------------------------------------------------------------- Vitals Details Patient Name: Date of Service: [name] [name] RD 03/09/2021 8:45 A M Medical Record Number: [id number] Patient Account Number: [id number] Date of Birth/Sex: Treating RN: 20-Oct-1970 (51 y.o. M) Primary Care Jadd Gasior: PA [name] [name], NO Other Clinician: Referring Arisha Gervais: Treating Cari Vandeberg/Extender: [name] [name] Weeks in Treatment: 0 Vital Signs Time Taken: 08:40 Temperature (F): 98.5 Height (in): 71 Pulse (bpm): 87 Weight (lbs): 165 Respiratory Rate (breaths/min): 18 Body Mass Index (BMI): 23 Blood Pressure (mmHg): 152/83 Reference Range: 80 - 120 mg / dl Electronic Signature(s) Signed: 03/09/2021 1:10:25 PM By: [name] [name] Entered By: [name] [name] on 03/09/2021 08:41:19

## 2021-03-16 ENCOUNTER — Encounter (HOSPITAL_BASED_OUTPATIENT_CLINIC_OR_DEPARTMENT_OTHER): Payer: Self-pay | Admitting: General Surgery

## 2021-03-16 ENCOUNTER — Other Ambulatory Visit: Payer: Self-pay

## 2021-03-16 NOTE — Progress Notes (Signed)
MALCOMB, GANGEMI ([id number]) Visit Report for 03/16/2021 Arrival Information Details Patient Name: Date of Service: [name] [name], [name] [name] 03/16/2021 8:30 A M Medical Record Number: [id number] Patient Account Number: [id number] Date of Birth/Sex: Treating RN: 1970-12-06 (51 y.o. [name] Primary Care Kinsey Karch: PA Zenovia Jordan, [name] [name] Other Clinician: Referring Siomara Burkel: Treating Jazir Newey/Extender: Edwena Bunde Weeks in Treatment: 1 Visit Information History Since Last Visit Added or deleted any medications: [name] [name] Patient Arrived: Ambulatory Any new allergies or adverse reactions: [name] [name] Arrival Time: 08:28 Had a fall or experienced change in [name] [name] Accompanied By: alone activities of daily living that may affect Transfer Assistance: None risk of falls: Patient Identification Verified: Yes Signs or symptoms of abuse/neglect since last visito [name] [name] Secondary Verification Process Completed: Yes Hospitalized since last visit: [name] [name] Patient Has Alerts: [name] [name] Implantable device outside of the clinic excluding [name] [name] cellular tissue based products placed in the center since last visit: Has Dressing in Place as Prescribed: Yes Pain Present Now: [name] [name] Electronic Signature(s) Signed: 03/16/2021 1:38:56 PM By: Zandra Abts RN, BSN Entered By: Zandra Abts on 03/16/2021 08:28:25 -------------------------------------------------------------------------------- Encounter Discharge Information Details Patient Name: Date of Service: [name] [name] [name] 03/16/2021 8:30 A M Medical Record Number: [id number] Patient Account Number: [id number] Date of Birth/Sex: Treating RN: 04-05-1970 (51 y.o. [name] Primary Care Ballard Budney: PA Zenovia Jordan, [name] [name] Other Clinician: Referring Adreana Coull: Treating Malonie Tatum/Extender: Edwena Bunde Weeks in Treatment: 1 Encounter Discharge Information Items Post Procedure Vitals Discharge Condition: Stable Temperature (F): 97.8 Ambulatory Status:  Ambulatory Pulse (bpm): 90 Discharge Destination: Home Respiratory Rate (breaths/min): 16 Transportation: Private Auto Blood Pressure (mmHg): 131/80 Accompanied By: alone Schedule Follow-up Appointment: Yes Clinical Summary of Care: Patient Declined Electronic Signature(s) Signed: 03/16/2021 1:38:56 PM By: Zandra Abts RN, BSN Entered By: Zandra Abts on 03/16/2021 12:01:04 -------------------------------------------------------------------------------- Lower Extremity Assessment Details Patient Name: Date of Service: [name] [name], [name] [name] 03/16/2021 8:30 A M Medical Record Number: [id number] Patient Account Number: [id number] Date of Birth/Sex: Treating RN: Nov 20, 1970 (51 y.o. [name] Primary Care Petar Mucci: PA Zenovia Jordan, [name] [name] Other Clinician: Referring Keoki Mchargue: Treating Dechelle Attaway/Extender: Edwena Bunde Weeks in Treatment: 1 Edema Assessment Assessed: [Left: [name] [name]] [Right: [name] [name]] E[Left: dema] [Right: :] Calf Left: Right: Point of Measurement: 33 cm From Medial Instep 31.2 cm Ankle Left: Right: Point of Measurement: 10 cm From Medial Instep 18.5 cm Vascular Assessment Pulses: Dorsalis Pedis Palpable: [Right:Yes] Electronic Signature(s) Signed: 03/16/2021 1:38:56 PM By: Zandra Abts RN, BSN Entered By: Zandra Abts on 03/16/2021 08:32:24 -------------------------------------------------------------------------------- Multi Wound Chart Details Patient Name: Date of Service: [name] [name] [name] 03/16/2021 8:30 A M Medical Record Number: [id number] Patient Account Number: [id number] Date of Birth/Sex: Treating RN: Jul 01, 1970 (51 y.o. M) Primary Care Torri Langston: PA Zenovia Jordan, [name] [name] Other Clinician: Referring Ashwin Tibbs: Treating Faiga Stones/Extender: Edwena Bunde Weeks in Treatment: 1 Vital Signs Height(in): 71 Pulse(bpm): 90 Weight(lbs): 165 Blood Pressure(mmHg): 131/80 Body Mass Index(BMI): 23 Temperature(F): 97.8 Respiratory  Rate(breaths/min): 16 Photos: [1:Right, Medial, Anterior Lower Leg] [N/A:N/A N/A] Wound Location: [1:Trauma] [N/A:N/A] Wounding Event: [1:Trauma, Other] [N/A:N/A] Primary Etiology: [1:01/19/2021] [N/A:N/A] Date Acquired: [1:1] [N/A:N/A] Weeks of Treatment: [1:Open] [N/A:N/A] Wound Status: [1:[name] [name]] [N/A:N/A] Wound Recurrence: [1:7.5x2x0.5] [N/A:N/A] Measurements L x W x D (cm) [1:11.781] [N/A:N/A] A (cm) : rea [1:5.89] [N/A:N/A] Volume (cm) : [1:16.70%] [N/A:N/A] % Reduction in A rea: [1:40.50%] [N/A:N/A] % Reduction in Volume: [1:1] Position 1 (o'clock): [1:1.7] Maximum Distance 1 (cm): [1:Yes] [N/A:N/A] Tunneling: [1:Full Thickness With Exposed Support N/A] Classification: [1:Structures Medium] [N/A:N/A] Exudate A mount: [1:Serosanguineous] [N/A:N/A] Exudate  Type: [1:red, brown] [N/A:N/A] Exudate Color: [1:Well defined, not attached] [N/A:N/A] Wound Margin: [1:Large (67-100%)] [N/A:N/A] Granulation A mount: [1:Red] [N/A:N/A] Granulation Quality: [1:Small (1-33%)] [N/A:N/A] Necrotic A mount: [1:Fat Layer (Subcutaneous Tissue): Yes N/A] Exposed Structures: [1:Bone: Yes Fascia: [name] [name] Tendon: [name] [name] Muscle: [name] [name] Joint: [name] [name] Small (1-33%)] [N/A:N/A] Epithelialization: [1:Debridement - Selective/Open Wound N/A] Debridement: Pre-procedure Verification/Time Out 08:53 [N/A:N/A] Taken: [1:Slough] [N/A:N/A] Tissue Debrided: [1:Non-Viable Tissue] [N/A:N/A] Level: [1:4] [N/A:N/A] Debridement A (sq cm): [1:rea Curette] [N/A:N/A] Instrument: [1:Minimum] [N/A:N/A] Bleeding: [1:Pressure] [N/A:N/A] Hemostasis A chieved: [1:4] [N/A:N/A] Procedural Pain: [1:2] [N/A:N/A] Post Procedural Pain: [1:Procedure was tolerated well] [N/A:N/A] Debridement Treatment Response: [1:7.5x2x0.5] [N/A:N/A] Post Debridement Measurements L x W x D (cm) [1:5.89] [N/A:N/A] Post Debridement Volume: (cm) [1:Debridement] [N/A:N/A] Treatment Notes Electronic Signature(s) Signed: 03/16/2021 8:58:49 AM By: [name] MD  FACS Entered By: [name] on 03/16/2021 08:58:49 -------------------------------------------------------------------------------- Multi-Disciplinary Care Plan Details Patient Name: Date of Service: [name] [name] [name] 03/16/2021 8:30 A M Medical Record Number: [id number] Patient Account Number: [id number] Date of Birth/Sex: Treating RN: 01-Dec-1970 (51 y.o. [name] Primary Care Davinder Haff: PA Zenovia Jordan, [name] [name] Other Clinician: Referring Laysa Kimmey: Treating Ayven Glasco/Extender: Edwena Bunde Weeks in Treatment: 1 Active Inactive Pain, Acute or Chronic Nursing Diagnoses: Pain, acute or chronic: actual or potential Goals: Patient will verbalize adequate pain control and receive pain control interventions during procedures as needed Date Initiated: 03/05/2021 Target Resolution Date: 04/02/2021 Goal Status: Active Interventions: Complete pain assessment as per visit requirements Notes: Wound/Skin Impairment Nursing Diagnoses: Impaired tissue integrity Knowledge deficit related to ulceration/compromised skin integrity Goals: Patient/caregiver will verbalize understanding of skin care regimen Date Initiated: 03/05/2021 Target Resolution Date: 04/06/2021 Goal Status: Active Ulcer/skin breakdown will have a volume reduction of 30% by week 4 Date Initiated: 03/05/2021 Target Resolution Date: 04/06/2021 Goal Status: Active Interventions: Assess patient/caregiver ability to obtain necessary supplies Assess patient/caregiver ability to perform ulcer/skin care regimen upon admission and as needed Assess ulceration(s) every visit Provide education on ulcer and skin care Notes: Electronic Signature(s) Signed: 03/16/2021 1:38:56 PM By: Zandra Abts RN, BSN Entered By: Zandra Abts on 03/16/2021 08:45:31 -------------------------------------------------------------------------------- Pain Assessment Details Patient Name: Date of Service: [name] [name] [name]  03/16/2021 8:30 A M Medical Record Number: [id number] Patient Account Number: [id number] Date of Birth/Sex: Treating RN: [birth year] (51 y.o. [name] Primary Care Chanika Byland: PA Zenovia Jordan, [name] [name] Other Clinician: Referring Cheick Suhr: Treating Jolynn Bajorek/Extender: Edwena Bunde Weeks in Treatment: 1 Active Problems Location of Pain Severity and Description of Pain Patient Has Paino Yes Site Locations Pain Location: Pain in Ulcers With Dressing Change: Yes Duration of the Pain. Constant / Intermittento Intermittent Rate the pain. Current Pain Level: 6 Character of Pain Describe the Pain: Burning Pain Management and Medication Current Pain Management: Medication: Yes Cold Application: [name] [name] Rest: [name] [name] Massage: [name] [name] Activity: [name] [name] T.E.N.S.: [name] [name] Heat Application: [name] [name] Leg drop or elevation: [name] [name] Is the Current Pain Management Adequate: Adequate How does your wound impact your activities of daily livingo Sleep: [name] [name] Bathing: [name] [name] Appetite: [name] [name] Relationship With Others: [name] [name] Bladder Continence: [name] [name] Emotions: [name] [name] Bowel Continence: [name] [name] Work: [name] [name] Toileting: [name] [name] Drive: [name] [name] Dressing: [name] [name] Hobbies: [name] [name] Psychologist, prison and probation services) Signed: 03/16/2021 1:38:56 PM By: Zandra Abts RN, BSN Entered By: Zandra Abts on 03/16/2021 08:55:37 -------------------------------------------------------------------------------- Patient/Caregiver Education Details Patient Name: Date of Service: [name] [name] [name] 3/10/2023andnbsp8:30 A M Medical Record Number: [id number] Patient Account Number: [id number] Date of Birth/Gender: Treating RN: [birth year] (51 y.o. [name] Primary Care Physician: PA Zenovia Jordan, [name] [name] Other Clinician: Referring Physician: Treating Physician/Extender:  Dorthula Rueannon, Jennifer Allen, Anthony Terrance Weeks in Treatment: 1 Education Assessment Education Provided To: Patient Education Topics Provided Wound/Skin Impairment: Methods: Explain/Verbal Responses: State content  correctly [employer] CompanyElectronic Signature(s) Signed: 03/16/2021 1:38:56 PM By: Zandra AbtsLynch, Shatara RN, BSN Entered By: Zandra AbtsLynch, Shatara on 03/16/2021 08:45:43 -------------------------------------------------------------------------------- Wound Assessment Details Patient Name: Date of Service: [name] EndoKING, [name] [name] 03/16/2021 8:30 A M Medical Record Number: [id number] Patient Account Number: [id number] Date of Birth/Sex: Treating RN: 01/26/1970 (51 y.o. Elizebeth KollerM) Lynch, Shatara Primary Care Rod Majerus: PA [name] [name], [name] [name] Other Clinician: Referring Ravis Herne: Treating Ac Colan/Extender: Edwena Bundeannon, Jennifer Allen, Anthony Terrance Weeks in Treatment: 1 Wound Status Wound Number: 1 Primary Etiology: Trauma, Other Wound Location: Right, Medial, Anterior Lower Leg Wound Status: Open Wounding Event: Trauma Date Acquired: 01/19/2021 Weeks Of Treatment: 1 Clustered Wound: [name] [name] Photos Wound Measurements Length: (cm) 7.5 Width: (cm) 2 Depth: (cm) 0.5 Area: (cm) 11.781 Volume: (cm) 5.89 % Reduction in Area: 16.7% % Reduction in Volume: 40.5% Epithelialization: Small (1-33%) Tunneling: Yes Position (o'clock): 1 Maximum Distance: (cm) 1.7 Wound Description Classification: Full Thickness With Exposed Support Structures Wound Margin: Well defined, not attached Exudate Amount: Medium Exudate Type: Serosanguineous Exudate Color: red, brown Foul Odor After Cleansing: [name] [name] Slough/Fibrino Yes Wound Bed Granulation Amount: Large (67-100%) Exposed Structure Granulation Quality: Red Fascia Exposed: [name] [name] Necrotic Amount: Small (1-33%) Fat Layer (Subcutaneous Tissue) Exposed: Yes Necrotic Quality: Adherent Slough Tendon Exposed: [name] [name] Muscle Exposed: [name] [name] Joint Exposed: [name] [name] Bone Exposed: Yes Treatment Notes Wound #1 (Lower Leg) Wound Laterality: Right, Medial, Anterior Cleanser Soap and Water Discharge Instruction: May shower and wash wound with dial antibacterial soap and water prior to dressing change. Wound Cleanser Discharge  Instruction: Cleanse the wound with wound cleanser prior to applying a clean dressing using gauze sponges, not tissue or cotton balls. Peri-Wound Care Triamcinolone 15 (g) Discharge Instruction: Use triamcinolone 15 (g) as directed Zinc Oxide Ointment 30g tube Discharge Instruction: Apply Zinc Oxide to periwound with each dressing change Sween Lotion (Moisturizing lotion) Discharge Instruction: Apply moisturizing lotion as directed Topical Gentamicin Discharge Instruction: As directed by physician Primary Dressing IODOFLEX 0.9% Cadexomer Iodine Pad 4x6 cm Discharge Instruction: Apply to wound bed as instructed Secondary Dressing Zetuvit Plus 4x8 in Discharge Instruction: Apply over primary dressing as directed. Secured With [employer] GroupKerlix Roll Sterile, 4.5x3.1 (in/yd) Discharge Instruction: Secure with Kerlix as directed. Compression Wrap Kerlix Roll 4.5x3.1 (in/yd) Discharge Instruction: Apply Kerlix and Coban compression as directed. Coban Self-Adherent Wrap 4x5 (in/yd) Discharge Instruction: Apply over Kerlix as directed. Compression Stockings Add-Ons Electronic Signature(s) Signed: 03/16/2021 1:38:56 PM By: Zandra AbtsLynch, Shatara RN, BSN Entered By: Zandra AbtsLynch, Shatara on 03/16/2021 08:53:50 -------------------------------------------------------------------------------- Vitals Details Patient Name: Date of Service: [name] EndoKING, [name] [name] 03/16/2021 8:30 A M Medical Record Number: [id number] Patient Account Number: [id number] Date of Birth/Sex: Treating RN: 06/24/1970 (51 y.o. Elizebeth KollerM) Lynch, Shatara Primary Care Nisa Decaire: PA Zenovia JordanIENT, [name] [name] Other Clinician: Referring Mariyah Upshaw: Treating Kayonna Lawniczak/Extender: Edwena Bundeannon, Jennifer Allen, Anthony Terrance Weeks in Treatment: 1 Vital Signs Time Taken: 08:28 Temperature (F): 97.8 Height (in): 71 Pulse (bpm): 90 Weight (lbs): 165 Respiratory Rate (breaths/min): 16 Body Mass Index (BMI): 23 Blood Pressure (mmHg): 131/80 Reference Range: 80 - 120 mg / dl Electronic  Signature(s) Signed: 03/16/2021 1:38:56 PM By: Zandra AbtsLynch, Shatara RN, BSN Entered By: Zandra AbtsLynch, Shatara on 03/16/2021 08:29:20

## 2021-03-19 NOTE — Progress Notes (Signed)
[name], CHRISTOFFERSEN ([id number]) Visit Report for 03/16/2021 Chief Complaint Document Details Patient Name: Date of Service: [name] [name], PLEMONS [name] 03/16/2021 8:30 A M Medical Record Number: [id number] Patient Account Number: [id number] Date of Birth/Sex: Treating RN: 1970-08-22 (51 y.o. M) Primary Care Provider: PA Zenovia Jordan, NO Other Clinician: Referring Provider: Treating Provider/Extender: Edwena Bunde Weeks in Treatment: 1 Information Obtained from: Patient Chief Complaint ADMISSION: 03/09/2021: Patient seen for complaints of Non-Healing Wound. Electronic Signature(s) Signed: 03/16/2021 8:59:00 AM By: [name] MD FACS Entered By: [name] on 03/16/2021 08:58:59 -------------------------------------------------------------------------------- Debridement Details Patient Name: Date of Service: [name] [name] [name] 03/16/2021 8:30 A M Medical Record Number: [id number] Patient Account Number: [id number] Date of Birth/Sex: Treating RN: August 26, 1970 (51 y.o. [name] [name] Primary Care Provider: PA Zenovia Jordan, NO Other Clinician: Referring Provider: Treating Provider/Extender: Edwena Bunde Weeks in Treatment: 1 Debridement Performed for Assessment: Wound #1 Right,Medial,Anterior Lower Leg Performed By: Physician [name], MD Debridement Type: Debridement Level of Consciousness (Pre-procedure): Awake and Alert Pre-procedure Verification/Time Out Yes - 08:53 Taken: Start Time: 08:53 T Area Debrided (L x W): otal 2 (cm) x 2 (cm) = 4 (cm) Tissue and other material debrided: Non-Viable, Slough, Slough Level: Non-Viable Tissue Debridement Description: Selective/Open Wound Instrument: Curette Bleeding: Minimum Hemostasis Achieved: Pressure End Time: 08:54 Procedural Pain: 4 Post Procedural Pain: 2 Response to Treatment: Procedure was tolerated well Level of Consciousness (Post- Awake and Alert procedure): Post Debridement  Measurements of Total Wound Length: (cm) 7.5 Width: (cm) 2 Depth: (cm) 0.5 Volume: (cm) 5.89 Character of Wound/Ulcer Post Debridement: Improved Post Procedure Diagnosis Same as Pre-procedure Electronic Signature(s) Signed: 03/16/2021 1:38:56 PM By: Zandra Abts RN, BSN Signed: 03/19/2021 7:34:41 AM By: [name] MD FACS Entered By: Zandra Abts on 03/16/2021 08:54:58 -------------------------------------------------------------------------------- HPI Details Patient Name: Date of Service: [name] [name] [name] 03/16/2021 8:30 A M Medical Record Number: [id number] Patient Account Number: [id number] Date of Birth/Sex: Treating RN: 1970/02/06 (51 y.o. M) Primary Care Provider: PA Zenovia Jordan, NO Other Clinician: Referring Provider: Treating Provider/Extender: Edwena Bunde Weeks in Treatment: 1 History of Present Illness HPI Description: ADMISSION 03/05/2021: This is a 51 year old man who was injured at work when a metal bar struck his right lower leg. He presented to the emergency department at [facility] where he underwent radiographic imaging that did not show a fracture. The laceration was sutured and the patient was discharged from the emergency room. He returned to the emergency room to weeks later with separation of his wound. At that time, no additional studies were performed, but he was placed on a course of antibiotics (doxycycline and Keflex). He completed those about a week ago. He has continued to have significant pain as well as drainage from the site. He has been applying triple antibiotic ointment and a nonstick gauze to the site without significant improvement. He has not experienced any fevers or chills. There is some drainage, but significantly less than prior, per his report. He is accompanied by his son today. He is otherwise healthy without any significant medical history. He takes no prescribed home medications. CLINICAL DATA:  Laceration, hit with metal bar EXAM: RIGHT TIBIA AND FIBULA - 2 VIEW COMPARISON: 06/26/2018 FINDINGS: Frontal and lateral views of the right tibia and fibula are obtained. No acute displaced fracture. Right knee and ankle are well aligned. Mild soft tissue swelling involving the anterolateral right lower leg. No radiopaque foreign body. IMPRESSION: 1. Mild soft tissue swelling. No fracture or radiopaque foreign body.  No labs were performed at either emergency department evaluation. 03/09/2021: The wound is slightly smaller today. He still has some tenderness associated with it. Still with some drainage. Culture taken on Monday returned with a very small amount of Escherichia coli, sensitive to gentamicin. He is currently getting topical gentamicin ointment with Iodoflex. He is not in compression. 03/15/2021: Continued improvement in the wound. Granulation tissue beginning to fill in. Still with some drainage. Still fairly tender, but without signs of significant infection. Still with topical gentamicin and Iodoflex with Kerlix and Coban wraps. Electronic Signature(s) Signed: 03/16/2021 9:00:02 AM By: [name] MD FACS Entered By: [name] on 03/16/2021 09:00:01 -------------------------------------------------------------------------------- Physical Exam Details Patient Name: Date of Service: [name] [name], [name] [name] 03/16/2021 8:30 A M Medical Record Number: [id number] Patient Account Number: [id number] Date of Birth/Sex: Treating RN: 09/06/1970 (51 y.o. M) Primary Care Provider: Other Clinician: PA TIENT, NO Referring Provider: Treating Provider/Extender: Edwena Bunde Weeks in Treatment: 1 Constitutional . . . . No acute distress. Respiratory Normal work of breathing on room air. Notes 03/15/2021: Wound evaluationthe wound continues to contract. There are significant areas of granulation tissue forming. The tunnel is smaller today. Still a small area of  tibia exposure, but this is less than prior. Minimal fibrinous slough. No odor. Electronic Signature(s) Signed: 03/16/2021 9:01:22 AM By: [name] MD FACS Entered By: [name] on 03/16/2021 09:01:22 -------------------------------------------------------------------------------- Physician Orders Details Patient Name: Date of Service: [name] [name] [name] 03/16/2021 8:30 A M Medical Record Number: [id number] Patient Account Number: [id number] Date of Birth/Sex: Treating RN: [birth year] (51 y.o. 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 - Dr. Lady Gary - Room 2 Return A Bathing/ Shower/ Hygiene May shower with protection but do not get wound dressing(s) wet. - Ok to use Chartered loss adjuster, can purchase at [facility], [facility], or [employer] Edema Control - Lymphedema / SCD / Other Right Lower Extremity Elevate legs to the level of the heart or above for 30 minutes daily and/or when sitting, a frequency of: - Throughout the day Avoid standing for long periods of time. Wound Treatment Wound #1 - Lower Leg Wound Laterality: Right, Medial, Anterior Cleanser: Soap and Water 1 x Per Week/30 Days Discharge Instructions: May shower and wash wound with dial antibacterial soap and water prior to dressing change. Cleanser: Wound Cleanser 1 x Per Week/30 Days Discharge Instructions: Cleanse the wound with wound cleanser prior to applying a clean dressing using gauze sponges, not tissue or cotton balls. Peri-Wound Care: Triamcinolone 15 (g) 1 x Per Week/30 Days Discharge Instructions: Use triamcinolone 15 (g) as directed Peri-Wound Care: Zinc Oxide Ointment 30g  tube 1 x Per Week/30 Days Discharge Instructions: Apply Zinc Oxide to periwound with each dressing change Peri-Wound Care: Sween Lotion (Moisturizing lotion) 1 x Per Week/30 Days Discharge Instructions: Apply moisturizing lotion as directed Topical: Gentamicin 1 x Per Week/30 Days Discharge Instructions: As directed by physician Prim Dressing: IODOFLEX 0.9% Cadexomer Iodine Pad 4x6 cm ary 1 x Per Week/30 Days Discharge Instructions: Apply to wound bed as instructed Secondary Dressing: Zetuvit Plus 4x8 in 1 x Per Week/30 Days Discharge Instructions: Apply over primary dressing as directed. Secured With: [employer], 4.5x3.1 (in/yd) 1 x Per Week/30 Days  Discharge Instructions: Secure with Kerlix as directed. Compression Wrap: Kerlix Roll 4.5x3.1 (in/yd) 1 x Per Week/30 Days Discharge Instructions: Apply Kerlix and Coban compression as directed. Compression Wrap: Coban Self-Adherent Wrap 4x5 (in/yd) 1 x Per Week/30 Days Discharge Instructions: Apply over Kerlix as directed. Electronic Signature(s) Signed: 03/19/2021 7:34:41 AM By: Duanne Guessannon, Cris Talavera MD FACS Entered By: Duanne Guessannon, Azure Budnick on 03/16/2021 09:01:53 -------------------------------------------------------------------------------- Problem List Details Patient Name: Date of Service: [name] [name], [name] [name] 03/16/2021 8:30 A M Medical Record Number: [id number] Patient Account Number: [id number] Date of Birth/Sex: Treating RN: 05/29/1970 (51 y.o. [name] KollerM) Lynch, Shatara Primary Care Provider: PA Zenovia JordanIENT, NO Other Clinician: Referring Provider: Treating Provider/Extender: Edwena Bundeannon, Brinley Treanor Allen, Anthony Terrance Weeks in Treatment: 1 Active Problems ICD-10 Encounter Code Description Active Date MDM Diagnosis L97.813 Non-pressure chronic ulcer of other part of right lower leg with necrosis of 03/05/2021 No Yes muscle Inactive Problems Resolved Problems Electronic Signature(s) Signed: 03/16/2021 8:58:12 AM By: Duanne Guessannon, Vinnie Gombert MD  FACS Entered By: Duanne Guessannon, Kenzy Campoverde on 03/16/2021 08:58:12 -------------------------------------------------------------------------------- Progress Note Details Patient Name: Date of Service: [name] [name], [name] [name] 03/16/2021 8:30 A M Medical Record Number: [id number] Patient Account Number: [id number] Date of Birth/Sex: Treating RN: 08/26/1970 (51 y.o. M) Primary Care Provider: PA Zenovia JordanIENT, NO Other Clinician: Referring Provider: Treating Provider/Extender: Edwena Bundeannon, Hiawatha Merriott Allen, Anthony Terrance Weeks in Treatment: 1 Subjective Chief Complaint Information obtained from Patient ADMISSION: 03/09/2021: Patient seen for complaints of Non-Healing Wound. History of Present Illness (HPI) ADMISSION 03/05/2021: This is a 51 year old man who was injured at work when a metal bar struck his right lower leg. [facility] where he underwent radiographic imaging that did not show a fracture. The laceration was sutured and the patient was discharged from the emergency room. He returned to the emergency room to weeks later with separation of his wound. At that time, no additional studies were performed, but he was placed on a course of antibiotics (doxycycline and Keflex). He completed those about a week ago. He has continued to have significant pain as well as drainage from the site. He has been applying triple antibiotic ointment and a nonstick gauze to the site without significant improvement. He has not experienced any fevers or chills. There is some drainage, but significantly less than prior, per his report. He is accompanied by his son today. He is otherwise healthy without any significant medical history. He takes no prescribed home medications. CLINICAL DATA: Laceration, hit with metal bar EXAM: RIGHT TIBIA AND FIBULA - 2 VIEW COMPARISON: 06/26/2018 FINDINGS: Frontal and lateral views of the right tibia and fibula are obtained. No acute displaced  fracture. Right knee and ankle are well aligned. Mild soft tissue swelling involving the anterolateral right lower leg. No radiopaque foreign body. IMPRESSION: 1. Mild soft tissue swelling. No fracture or radiopaque foreign body. No labs were performed at either emergency department evaluation. 03/09/2021: The wound is slightly smaller today. He still has some tenderness associated with it. Still with some drainage. Culture taken on Monday returned with a very small amount of Escherichia coli, sensitive to gentamicin. He is currently getting topical gentamicin ointment with Iodoflex. He is not in compression. 03/15/2021: Continued improvement in the wound. Granulation tissue beginning to fill in. Still with some drainage. Still fairly tender, but without signs of significant infection. Still with topical gentamicin and Iodoflex with Kerlix and Coban wraps. Patient History Information obtained from Patient. Social History Current some day smoker - 1 pack per day - started on 11/07/1991, Marital Status -  Divorced, Alcohol Use - Rarely, Drug Use - No History, Caffeine Use - Rarely. Medical History Integumentary (Skin) Denies history of History of Burn Hospitalization/Surgery History - Tooth extractions. Medical A Surgical History Notes nd Musculoskeletal Heel Fracture Objective Constitutional No acute distress. Vitals Time Taken: 8:28 AM, Height: 71 in, Weight: 165 lbs, BMI: 23, Temperature: 97.8 F, Pulse: 90 bpm, Respiratory Rate: 16 breaths/min, Blood Pressure: 131/80 mmHg. Respiratory Normal work of breathing on room air. General Notes: 03/15/2021: Wound evaluationoothe wound continues to contract. There are significant areas of granulation tissue forming. The tunnel is smaller today. Still a small area of tibia exposure, but this is less than prior. Minimal fibrinous slough. No odor. Integumentary (Hair, Skin) Wound #1 status is Open. Original cause of wound was Trauma. The date  acquired was: 01/19/2021. The wound has been in treatment 1 weeks. The wound is located on the Right,Medial,Anterior Lower Leg. The wound measures 7.5cm length x 2cm width x 0.5cm depth; 11.781cm^2 area and 5.89cm^3 volume. There is bone and Fat Layer (Subcutaneous Tissue) exposed. There is tunneling at 1:00 with a maximum distance of 1.7cm. There is a medium amount of serosanguineous drainage noted. The wound margin is well defined and not attached to the wound base. There is large (67-100%) red granulation within the wound bed. There is a small (1-33%) amount of necrotic tissue within the wound bed including Adherent Slough. 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 There was a Selective/Open Wound Non-Viable Tissue Debridement with a total area of 4 sq cm performed by [name], MD. With the following instrument(s): Curette to remove Non-Viable tissue/material. Material removed includes [facility]. No specimens were taken. A time out was conducted at 08:53, prior to the start of the procedure. A Minimum amount of bleeding was controlled with Pressure. The procedure was tolerated well with a pain level of 4 throughout and a pain level of 2 following the procedure. Post Debridement Measurements: 7.5cm length x 2cm width x 0.5cm depth; 5.89cm^3 volume. Character of Wound/Ulcer Post Debridement is improved. Post procedure Diagnosis Wound #1: Same as Pre-Procedure Plan Follow-up Appointments: Return Appointment in 1 week. - Dr. Lady Gary - Room 2 Bathing/ Shower/ Hygiene: May shower with protection but do not get wound dressing(s) wet. - Ok to use Chartered loss adjuster, can purchase at [facility], [facility], or [employer] Edema Control - Lymphedema / SCD / Other: Elevate legs to the level of the heart or above for 30  minutes daily and/or when sitting, a frequency of: - Throughout the day Avoid standing for long periods of time. WOUND #1: - Lower Leg Wound Laterality: Right, Medial, Anterior Cleanser: Soap and Water 1 x Per Week/30 Days Discharge Instructions: May shower and wash wound with dial antibacterial soap and water prior to dressing change. Cleanser: Wound Cleanser 1 x Per Week/30 Days Discharge Instructions: Cleanse the wound with wound cleanser prior to applying a clean dressing using gauze sponges, not tissue or cotton balls. Peri-Wound Care: Triamcinolone 15 (g) 1 x Per Week/30 Days Discharge Instructions: Use triamcinolone 15 (g) as directed Peri-Wound Care: Zinc Oxide Ointment 30g tube 1 x Per Week/30 Days Discharge Instructions: Apply Zinc Oxide to periwound with each dressing change Peri-Wound Care: Sween Lotion (Moisturizing lotion) 1 x Per Week/30 Days Discharge Instructions: Apply moisturizing lotion as directed Topical: Gentamicin 1 x Per Week/30 Days Discharge  Instructions: As directed by physician Prim Dressing: IODOFLEX 0.9% Cadexomer Iodine Pad 4x6 cm 1 x Per Week/30 Days ary Discharge Instructions: Apply to wound bed as instructed Secondary Dressing: Zetuvit Plus 4x8 in 1 x Per Week/30 Days Discharge Instructions: Apply over primary dressing as directed. Secured With: [employer], 4.5x3.1 (in/yd) 1 x Per Week/30 Days Discharge Instructions: Secure with Kerlix as directed. Com pression Wrap: Kerlix Roll 4.5x3.1 (in/yd) 1 x Per Week/30 Days Discharge Instructions: Apply Kerlix and Coban compression as directed. Com pression Wrap: Coban Self-Adherent Wrap 4x5 (in/yd) 1 x Per Week/30 Days Discharge Instructions: Apply over Kerlix as directed. 03/15/2021: The wound continues to contract. There are significant areas of granulation tissue forming. The tunnel is smaller today. Still a small area of tibia exposure, but this is less than prior. Minimal fibrinous slough. No  odor. Slough debrided today with a #3 curette. Continue gentamicin and Iodoflex to the wound bed with TCA and zinc oxide to the periwound. Kerlix and Coban wrap. Follow-up in 1 week. Electronic Signature(s) Signed: 03/16/2021 9:03:00 AM By: [name] MD FACS Entered By: [name] on 03/16/2021 09:03:00 -------------------------------------------------------------------------------- HxROS Details Patient Name: Date of Service: [name] [name] [name] 03/16/2021 8:30 A M Medical Record Number: [id number] Patient Account Number: [id number] Date of Birth/Sex: Treating RN: December 14, 1970 (51 y.o. M) Primary Care Provider: PA Zenovia Jordan, NO Other Clinician: Referring Provider: Treating Provider/Extender: Edwena Bunde Weeks in Treatment: 1 Information Obtained From Patient Integumentary (Skin) Medical History: Negative for: History of Burn Musculoskeletal Medical History: Past Medical History Notes: Heel Fracture Immunizations Pneumococcal Vaccine: Received Pneumococcal Vaccination: No Implantable Devices None Hospitalization / Surgery History Type of Hospitalization/Surgery Tooth extractions Family and Social History Current some day smoker - 1 pack per day - started on 11/07/1991; Marital Status - Divorced; Alcohol Use: Rarely; Drug Use: No History; Caffeine Use: Rarely; Financial Concerns: No; Food, Clothing or Shelter Needs: No; Support System Lacking: No; Transportation Concerns: No Electronic Signature(s) Signed: 03/19/2021 7:34:41 AM By: [name] MD FACS Entered By: [name] on 03/16/2021 09:00:09 -------------------------------------------------------------------------------- SuperBill Details Patient Name: Date of Service: OBRIEN, [name] [name] 03/16/2021 Medical Record Number: [id number] Patient Account Number: [id number] Date of Birth/Sex: Treating RN: October 23, 1970 (51 y.o. 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 Facility Procedures CPT4 Code: [id number] Description: [phone] - DEBRIDE WOUND 1ST 20 SQ CM OR < ICD-10 Diagnosis Description L97.813 Non-pressure chronic ulcer of other part of right lower leg with necrosis of mus Modifier: cle Quantity: 1 Physician Procedures : CPT4 Code Description Modifier [id number] - WC PHYS DEBR WO ANESTH 20 SQ CM ICD-10 Diagnosis Description 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

## 2021-03-23 ENCOUNTER — Other Ambulatory Visit: Payer: Self-pay

## 2021-03-23 ENCOUNTER — Encounter (HOSPITAL_BASED_OUTPATIENT_CLINIC_OR_DEPARTMENT_OTHER): Payer: Self-pay | Admitting: General Surgery

## 2021-03-23 NOTE — Progress Notes (Addendum)
LAVERNE, KLUGH ([id number]) ?Visit Report for 03/23/2021 ?Chief Complaint Document Details ?Patient Name: Date of Service: ?TOMISLAV, MICALE RD 03/23/2021 8:30 A M ?Medical Record Number: [id number] ?Patient Account Number: [id number] ?Date of Birth/Sex: Treating RN: ?1970/12/03 (51 y.o. M) ?Primary Care Provider: PA Zenovia Jordan, NO Other Clinician: ?Referring Provider: ?Treating Provider/Extender: [name] ?[name] ?Weeks in Treatment: 2 ?Information Obtained from: Patient ?Chief Complaint ?ADMISSION: ?03/09/2021: Patient seen for complaints of Non-Healing Wound. ?Electronic Signature(s) ?Signed: 03/23/2021 8:50:38 AM By: [name] MD FACS ?Entered By: [name] on 03/23/2021 08:50:37 ?-------------------------------------------------------------------------------- ?Debridement Details ?Patient Name: Date of Service: ?TORIANO, AIKEY RD 03/23/2021 8:30 A M ?Medical Record Number: [id number] ?Patient Account Number: [id number] ?Date of Birth/Sex: Treating RN: ?1970-12-07 (51 y.o. Judie Petit) Karie Schwalbe ?Primary Care Provider: PA Zenovia Jordan, NO Other Clinician: ?Referring Provider: ?Treating Provider/Extender: [name] ?[name] ?Weeks in Treatment: 2 ?Debridement Performed for Assessment: Wound #1 Right,Medial,Anterior Lower Leg ?Performed By: Physician [name], MD ?Debridement Type: Debridement ?Level of Consciousness (Pre-procedure): Awake and Alert ?Pre-procedure Verification/Time Out Yes - 08:47 ?Taken: ?Start Time: 08:47 ?Pain Control: Lidocaine 4% Topical Solution ?T Area Debrided (L x W): ?otal 8 (cm) x 2 (cm) = 16 (cm?) ?Tissue and other material debrided: Slough, Subcutaneous, Slough ?Level: Skin/Subcutaneous Tissue ?Debridement Description: Excisional ?Instrument: Curette ?Bleeding: Minimum ?Hemostasis Achieved: Pressure ?End Time: 08:48 ?Procedural Pain: 0 ?Post Procedural Pain: 0 ?Response to Treatment: Procedure was tolerated well ?Level of Consciousness (Post- Awake and  Alert ?procedure): ?Post Debridement Measurements of Total Wound ?Length: (cm) 8 ?Width: (cm) 2 ?Depth: (cm) 0.5 ?Volume: (cm?) 6.283 ?Character of Wound/Ulcer Post Debridement: Improved ?Post Procedure Diagnosis ?Same as Pre-procedure ?Electronic Signature(s) ?Signed: 03/23/2021 9:33:14 AM By: Karie Schwalbe RN ?Signed: 03/23/2021 12:16:32 PM By: [name] MD FACS ?Entered By: Karie Schwalbe on 03/23/2021 08:52:21 ?-------------------------------------------------------------------------------- ?HPI Details ?Patient Name: Date of Service: ?ATOM, SOLIVAN RD 03/23/2021 8:30 A M ?Medical Record Number: [id number] ?Patient Account Number: [id number] ?Date of Birth/Sex: Treating RN: ?[birth year] (51 y.o. M) ?Primary Care Provider: PA Zenovia Jordan, NO Other Clinician: ?Referring Provider: ?Treating Provider/Extender: [name] ?[name] ?Weeks in Treatment: 2 ?History of Present Illness ?HPI Description: ADMISSION ?03/05/2021: This is a 51 year old man who was injured at work when a metal bar struck his right lower leg. He presented to the emergency department at [facility] where he underwent radiographic imaging that did not show a fracture. The laceration was sutured and the patient was discharged from the ?emergency room. He returned to the emergency room to weeks later with separation of his wound. At that time, no additional studies were performed, but he ?was placed on a course of antibiotics (doxycycline and Keflex). He completed those about a week ago. He has continued to have significant pain as well as ?drainage from the site. He has been applying triple antibiotic ointment and a nonstick gauze to the site without significant improvement. He has not experienced ?any fevers or chills. There is some drainage, but significantly less than prior, per his report. He is accompanied by his son today. ?He is otherwise healthy without any significant medical history. He takes no prescribed home  medications. ?CLINICAL DATA: Laceration, hit with metal bar ?EXAM: ?RIGHT TIBIA AND FIBULA - 2 VIEW ?COMPARISON: 06/26/2018 ?FINDINGS: ?Frontal and lateral views of the right tibia and fibula are ?obtained. No acute displaced fracture. Right knee and ankle are well ?aligned. Mild soft tissue swelling involving the anterolateral right ?lower leg. No radiopaque foreign body. ?IMPRESSION: ?1. Mild soft tissue swelling. No fracture  or radiopaque foreign ?body. ?No labs were performed at either emergency department evaluation. ?03/09/2021: The wound is slightly smaller today. He still has some tenderness associated with it. Still with some drainage. Culture taken on Monday returned with ?a very small amount of Escherichia coli, sensitive to gentamicin. He is currently getting topical gentamicin ointment with Iodoflex. He is not in compression. ?03/15/2021: Continued improvement in the wound. Granulation tissue beginning to fill in. Still with some drainage. Still fairly tender, but without signs of significant ?infection. Still with topical gentamicin and Iodoflex with Kerlix and Coban wraps. ?03/23/2021: There is great granulation tissue filling in and covering the tibia now. Slough is significantly decreased. Periwound is the most tender area for him. ?Electronic Signature(s) ?Signed: 03/23/2021 8:52:27 AM By: [name] MD FACS ?Entered By: [name] on 03/23/2021 08:52:27 ?-------------------------------------------------------------------------------- ?Physical Exam Details ?Patient Name: Date of Service: ?MUKESH, KORNEGAY RD 03/23/2021 8:30 A M ?Medical Record Number: [id number] ?Patient Account Number: [id number] ?Date of Birth/Sex: Treating RN: ?1970-02-24 (51 y.o. M) ?Primary Care Provider: PA Zenovia Jordan, NO Other Clinician: ?Referring Provider: ?Treating Provider/Extender: [name] ?[name] ?Weeks in Treatment: 2 ?Constitutional ?. . . . No acute distress. ?Respiratory ?Normal work of breathing  on room air. ?Notes ?03/23/2021: Wound evaluationalthough the wound measures slightly longer today, the tunnel is significantly shorter. Granulation tissue covers the surface and ?this is pink and healthy appearing. Very minimal slough. The tibia is no longer exposed. ?Electronic Signature(s) ?Signed: 03/23/2021 8:53:25 AM By: [name] MD FACS ?Entered By: [name] on 03/23/2021 08:53:25 ?-------------------------------------------------------------------------------- ?Physician Orders Details ?Patient Name: Date of Service: ?DELL, BRINER RD 03/23/2021 8:30 A M ?Medical Record Number: [id number] ?Patient Account Number: [id number] ?Date of Birth/Sex: Treating RN: ?[birth year] (51 y.o. 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 - Dr. Lady Gary ?Return A ?Bathing/ Shower/ Hygiene ?May shower with protection but do not get wound dressing(s) wet. - Ok to use Chartered loss adjuster, can purchase at [facility], [facility], or [employer] ?Edema Control - Lymphedema / SCD / Other ?Right Lower Extremity ?Elevate legs to the level of the heart or above for 30 minutes daily and/or when sitting, a frequency of: - Throughout the day ?Avoid standing for long periods of time. ?Wound Treatment ?Wound #1 - Lower Leg Wound Laterality: Right, Medial, Anterior ?Cleanser: Soap and Water 1 x Per Week/30 Days ?Discharge Instructions: May shower and wash wound with dial antibacterial soap and water prior to dressing change. ?Cleanser: Wound Cleanser 1 x Per Week/30 Days ?Discharge Instructions: Cleanse the wound with wound cleanser prior to applying a clean dressing using gauze sponges, not tissue  or cotton balls. ?Peri-Wound Care: Triamcinolone 15 (g) 1 x Per Week/30 Days ?Discharge Instructions: Use triamcinolone 15 (g) as directed ?Peri-Wound Care: Sween Lotion (Moisturizing lotion) 1 x Per Week/

## 2021-03-23 NOTE — Progress Notes (Signed)
[name] [name], [name] [name] ([id number]) ?Visit Report for 03/23/2021 ?Arrival Information Details ?Patient Name: Date of Service: ?[name] [name], [name] [name] RD 03/23/2021 8:30 A M ?Medical Record Number: [id number] ?Patient Account Number: [id number] ?Date of Birth/Sex: Treating RN: ?Aug 02, 1970 (51 y.o. Judie Petit) Karie Schwalbe ?Primary Care Zofia Peckinpaugh: PA Zenovia Jordan, NO Other Clinician: ?Referring Evangela Heffler: ?Treating Yancy Knoble/Extender: [name] ?[name] ?Weeks in Treatment: 2 ?Visit Information History Since Last Visit ?Added or deleted any medications: No ?Patient Arrived: Ambulatory ?Any new allergies or adverse reactions: No ?Arrival Time: 08:28 ?Had a fall or experienced change in No ?Accompanied By: self ?activities of daily living that may affect ?Transfer Assistance: None ?risk of falls: ?Patient Has Alerts: No ?Signs or symptoms of abuse/neglect since last visito No ?Hospitalized since last visit: No ?Implantable device outside of the clinic excluding No ?cellular tissue based products placed in the center ?since last visit: ?Has Dressing in Place as Prescribed: Yes ?Has Compression in Place as Prescribed: Yes ?Pain Present Now: No ?Electronic Signature(s) ?Signed: 03/23/2021 9:33:14 AM By: Karie Schwalbe RN ?Entered By: Karie Schwalbe on 03/23/2021 08:28:47 ?-------------------------------------------------------------------------------- ?Encounter Discharge Information Details ?Patient Name: Date of Service: ?[name] [name], [name] [name] RD 03/23/2021 8:30 A M ?Medical Record Number: [id number] ?Patient Account Number: [id number] ?Date of Birth/Sex: Treating RN: ?1970/06/06 (51 y.o. Judie Petit) Karie Schwalbe ?Primary Care Fitzhugh Vizcarrondo: PA Zenovia Jordan, NO Other Clinician: ?Referring Dwan Fennel: ?Treating Martin Smeal/Extender: [name] ?[name] ?Weeks in Treatment: 2 ?Encounter Discharge Information Items Post Procedure Vitals ?Discharge Condition: Stable ?Temperature (F): 98.5 ?Ambulatory Status: Ambulatory ?Pulse (bpm): 77 ?Discharge Destination:  Home ?Respiratory Rate (breaths/min): 18 ?Transportation: Private Auto ?Blood Pressure (mmHg): 138/88 ?Accompanied By: son ?Schedule Follow-up Appointment: Yes ?Clinical Summary of Care: Patient Declined ?Electronic Signature(s) ?Signed: 03/23/2021 9:33:14 AM By: Karie Schwalbe RN ?Entered By: Karie Schwalbe on 03/23/2021 09:32:54 ?-------------------------------------------------------------------------------- ?Lower Extremity Assessment Details ?Patient Name: ?Date of Service: ?[name] [name], [name] [name] RD 03/23/2021 8:30 A M ?Medical Record Number: [id number] ?Patient Account Number: [id number] ?Date of Birth/Sex: ?Treating RN: ?[birth year] (51 y.o. Judie Petit) Karie Schwalbe ?Primary Care Maryhelen Lindler: PA TIENT, NO ?Other Clinician: ?Referring Tiffancy Moger: ?Treating Sumeet Geter/Extender: [name] ?[name] ?Weeks in Treatment: 2 ?Edema Assessment ?Assessed: [Left: No] [Right: No] ?E[Left: dema] [Right: :] ?Calf ?Left: Right: ?Point of Measurement: 33 cm From Medial Instep 31.8 cm ?Ankle ?Left: Right: ?Point of Measurement: 10 cm From Medial Instep 18.7 cm ?Electronic Signature(s) ?Signed: 03/23/2021 9:33:14 AM By: Karie Schwalbe RN ?Entered By: Karie Schwalbe on 03/23/2021 08:41:26 ?-------------------------------------------------------------------------------- ?Multi Wound Chart Details ?Patient Name: ?Date of Service: ?[name] [name], [name] [name] RD 03/23/2021 8:30 A M ?Medical Record Number: [id number] ?Patient Account Number: [id number] ?Date of Birth/Sex: ?Treating RN: ?09-05-1970 (51 y.o. M) ?Primary Care Traver Meckes: PA TIENT, NO ?Other Clinician: ?Referring Hedy Garro: ?Treating Lydon Vansickle/Extender: [name] ?[name] ?Weeks in Treatment: 2 ?Vital Signs ?Height(in): 71 ?Pulse(bpm): 77 ?Weight(lbs): 165 ?Blood Pressure(mmHg): 138/88 ?Body Mass Index(BMI): 23 ?Temperature(??F): 98.5 ?Respiratory Rate(breaths/min): 18 ?Photos: [1:No Photos Right, Medial, Anterior Lower Leg] [N/A:No Photos N/A Right, Medial, Anterior Lower  Leg N/A] ?Wound Location: [1:Trauma] [N/A:Trauma N/A] ?Wounding Event: [1:Trauma, Other] [N/A:Trauma, Other N/A] ?Primary Etiology: [1:01/19/2021] [N/A:01/19/2021 N/A] ?Date Acquired: [1:2] [N/A:2 N/A] ?Weeks of Treatment: [1:Open] [N/A:Open N/A] ?Wound Status: [1:No] [N/A:No N/A] ?Wound Recurrence: [1:8x2x0.5] [N/A:8x2x0.5 N/A] ?Measurements L x W x D (cm) [1:12.566] [N/A:12.566 N/A] ?A (cm?) : ?rea [1:6.283] [N/A:6.283 N/A] ?Volume (cm?) : [1:11.10%] [N/A:11.10% N/A] ?% Reduction in A rea: [1:36.50%] [N/A:36.50% N/A] ?% Reduction in Volume: [N/A:1] ?Starting Position 1 (o'clock): [N/A:2] ?Ending Position 1 (o'clock): [N/A:0.3] ?Maximum Distance 1 (cm): [1:No] [N/A:Yes N/A] ?Undermining: [  1:Full Thickness With Exposed Support Full Thickness With Exposed Support] [N/A:N/A] ?Classification: [1:Structures Medium] [N/A:Structures Medium N/A] ?Exudate Amount: [1:Serosanguineous] [N/A:Serosanguineous N/A] ?Exudate Type: [1:red, brown] [N/A:red, brown N/A] ?Exudate Color: [1:Well defined, not attached] [N/A:Well defined, not attached N/A] ?Wound Margin: [1:Large (67-100%)] [N/A:Large (67-100%) N/A] ?Granulation Amount: [1:Red] [N/A:Red N/A] ?Granulation Quality: [1:Small (1-33%)] [N/A:Small (1-33%) N/A] ?Necrotic Amount: ?[1:Fat Layer (Subcutaneous Tissue): Yes Fat Layer (Subcutaneous Tissue): Yes N/A] ?Exposed Structures: ?[1:Fascia: No Tendon: No Muscle: No Joint: No Bone: No Small (1-33%)] [N/A:Fascia: No Tendon: No Muscle: No Joint: No Bone: No Small (1-33%) N/A] ?Treatment Notes ?Electronic Signature(s) ?Signed: 03/23/2021 8:49:55 AM By: [name] MD FACS ?Entered By: [name] on 03/23/2021 08:49:55 ?-------------------------------------------------------------------------------- ?Multi-Disciplinary Care Plan Details ?Patient Name: ?Date of Service: ?[name] [name], [name] [name] RD 03/23/2021 8:30 A M ?Medical Record Number: [id number] ?Patient Account Number: [id number] ?Date of Birth/Sex: ?Treating RN: ?August 26, 1970 (51 y.o.  Judie Petit) Karie Schwalbe ?Primary Care Ezriel Boffa: PA TIENT, NO ?Other Clinician: ?Referring Elizebeth Kluesner: ?Treating Roneisha Stern/Extender: [name] ?[name] ?Weeks in Treatment: 2 ?Active Inactive ?Pain, Acute or Chronic ?Nursing Diagnoses: ?Pain, acute or chronic: actual or potential ?Goals: ?Patient will verbalize adequate pain control and receive pain control interventions during procedures as needed ?Date Initiated: 03/05/2021 ?Target Resolution Date: 04/02/2021 ?Goal Status: Active ?Interventions: ?Complete pain assessment as per visit requirements ?Notes: ?Wound/Skin Impairment ?Nursing Diagnoses: ?Impaired tissue integrity ?Knowledge deficit related to ulceration/compromised skin integrity ?Goals: ?Patient/caregiver will verbalize understanding of skin care regimen ?Date Initiated: 03/05/2021 ?Target Resolution Date: 04/06/2021 ?Goal Status: Active ?Ulcer/skin breakdown will have a volume reduction of 30% by week 4 ?Date Initiated: 03/05/2021 ?Target Resolution Date: 04/06/2021 ?Goal Status: Active ?Interventions: ?Assess patient/caregiver ability to obtain necessary supplies ?Assess patient/caregiver ability to perform ulcer/skin care regimen upon admission and as needed ?Assess ulceration(s) every visit ?Provide education on ulcer and skin care ?Notes: ?Electronic Signature(s) ?Signed: 03/23/2021 9:33:14 AM By: Karie Schwalbe RN ?Entered By: Karie Schwalbe on 03/23/2021 09:31:31 ?-------------------------------------------------------------------------------- ?Pain Assessment Details ?Patient Name: ?Date of Service: ?[name] [name], [name] [name] RD 03/23/2021 8:30 A M ?Medical Record Number: [id number] ?Patient Account Number: [id number] ?Date of Birth/Sex: ?Treating RN: ?[birth year] (51 y.o. Judie Petit) Karie Schwalbe ?Primary Care Domenica Weightman: PA TIENT, NO ?Other Clinician: ?Referring Pilar Corrales: ?Treating Blaise Grieshaber/Extender: [name] ?[name] ?Weeks in Treatment: 2 ?Active Problems ?Location of Pain Severity and  Description of Pain ?Patient Has Paino No ?Site Locations ?Pain Management and Medication ?Current Pain Management: ?Electronic Signature(s) ?Signed: 03/23/2021 9:33:14 AM By: Karie Schwalbe RN ?Lafayette Dragon

## 2021-03-30 ENCOUNTER — Other Ambulatory Visit: Payer: Self-pay

## 2021-03-30 ENCOUNTER — Encounter (HOSPITAL_BASED_OUTPATIENT_CLINIC_OR_DEPARTMENT_OTHER): Payer: Self-pay | Admitting: General Surgery

## 2021-03-30 NOTE — Progress Notes (Signed)
JACKOB, CROOKSTON ([id number]) ?Visit Report for 03/30/2021 ?Arrival Information Details ?Patient Name: Date of Service: ?FINDLEY, VI RD 03/30/2021 8:30 A M ?Medical Record Number: [id number] ?Patient Account Number: [id number] ?Date of Birth/Sex: Treating RN: ?19-Aug-1970 (51 y.o. M) ?Primary Care Marquice Uddin: PA Ha[place] Prophet, NO Other Clinician: ?Referring Mumtaz Lovins: ?Treating Meagan Spease/Extender: Fredirick Maudlin ?Monica Martinez ?Weeks in Treatment: 3 ?Visit Information History Since Last Visit ?Added or deleted any medications: No ?Patient Arrived: Ambulatory ?Any new allergies or adverse reactions: No ?Arrival Time: 08:29 ?Had a fall or experienced change in No ?Accompanied By: self ?activities of daily living that may affect ?Transfer Assistance: None ?risk of falls: ?Patient Identification Verified: Yes ?Signs or symptoms of abuse/neglect since last visito No ?Secondary Verification Process Completed: Yes ?Hospitalized since last visit: No ?Patient Has Alerts: No ?Implantable device outside of the clinic excluding No ?cellular tissue based products placed in the center ?since last visit: ?Has Dressing in Place as Prescribed: Yes ?Pain Present Now: No ?Electronic Signature(s) ?Signed: 03/30/2021 9:00:26 AM By: Sandre Kitty ?Entered By: Sandre Kitty on 03/30/2021 08:29:20 ?-------------------------------------------------------------------------------- ?Encounter Discharge Information Details ?Patient Name: Date of Service: ?PONCIANO, SHEALY RD 03/30/2021 8:30 A M ?Medical Record Number: [id number] ?Patient Account Number: [id number] ?Date of Birth/Sex: Treating RN: ?01-24-1970 (51 y.o. Janyth Contes ?Primary Care Nashalie Sallis: PA Ha[place] Prophet, NO Other Clinician: ?Referring Lajuanda Penick: ?Treating Dessiree Sze/Extender: Fredirick Maudlin ?Monica Martinez ?Weeks in Treatment: 3 ?Encounter Discharge Information Items Post Procedure Vitals ?Discharge Condition: Stable ?Temperature (F): 98.3 ?Ambulatory Status: Ambulatory ?Pulse (bpm):  76 ?Discharge Destination: Home ?Respiratory Rate (breaths/min): 18 ?Transportation: Private Auto ?Blood Pressure (mmHg): 136/87 ?Accompanied By: alone ?Schedule Follow-up Appointment: Yes ?Clinical Summary of Care: Patient Declined ?Electronic Signature(s) ?Signed: 03/30/2021 4:52:25 PM By: Levan Hurst RN, BSN ?Entered By: Levan Hurst on 03/30/2021 09:10:15 ?-------------------------------------------------------------------------------- ?Lower Extremity Assessment Details ?Patient Name: ?Date of Service: ?BRANDUN, PINN RD 03/30/2021 8:30 A M ?Medical Record Number: [id number] ?Patient Account Number: [id number] ?Date of Birth/Sex: ?Treating RN: ?[birth year] (51 y.o. Janyth Contes ?Primary Care Andre Gallego: PA [place], NO ?Other Clinician: ?Referring Anakin Varkey: ?Treating Lamekia Nolden/Extender: Fredirick Maudlin ?Monica Martinez ?Weeks in Treatment: 3 ?Edema Assessment ?Assessed: [Left: No] [Right: No] ?E[Left: dema] [Right: :] ?Calf ?Left: Right: ?Point of Measurement: 33 cm From Medial Instep 31.5 cm ?Ankle ?Left: Right: ?Point of Measurement: 10 cm From Medial Instep 18 cm ?Vascular Assessment ?Pulses: ?Dorsalis Pedis ?Palpable: [Right:Yes] ?Electronic Signature(s) ?Signed: 03/30/2021 4:52:25 PM By: Levan Hurst RN, BSN ?Entered By: Levan Hurst on 03/30/2021 08:40:45 ?-------------------------------------------------------------------------------- ?Multi Wound Chart Details ?Patient Name: ?Date of Service: ?NOAM, KARAFFA RD 03/30/2021 8:30 A M ?Medical Record Number: [id number] ?Patient Account Number: [id number] ?Date of Birth/Sex: ?Treating RN: ?08/11/1970 (51 y.o. Janyth Contes ?Primary Care Quadasia Newsham: PA [place], NO ?Other Clinician: ?Referring Delaynee Alred: ?Treating Mingo Siegert/Extender: Fredirick Maudlin ?Monica Martinez ?Weeks in Treatment: 3 ?Vital Signs ?Height(in): 71 ?Pulse(bpm): 76 ?Weight(lbs): 165 ?Blood Pressure(mmHg): 136/87 ?Body Mass Index(BMI): 23 ?Temperature(??F): 98.3 ?Respiratory  Rate(breaths/min): 18 ?Photos: [1:Right, Medial, Anterior Lower Leg] [N/A:N/A N/A] ?Wound Location: [1:Trauma] [N/A:N/A] ?Wounding Event: [1:Trauma, Other] [N/A:N/A] ?Primary Etiology: [1:01/19/2021] [N/A:N/A] ?Date Acquired: [1:3] [N/A:N/A] ?Weeks of Treatment: [1:Open] [N/A:N/A] ?Wound Status: [1:No] [N/A:N/A] ?Wound Recurrence: [1:7.9x1.9x0.3] [N/A:N/A] ?Measurements L x W x D (cm) [1:11.789] [N/A:N/A] ?A (cm?) : ?rea [1:3.537] [N/A:N/A] ?Volume (cm?) : [1:16.60%] [N/A:N/A] ?% Reduction in A rea: [1:64.30%] [N/A:N/A] ?% Reduction in Volume: [1:12] ?Starting Position 1 (o'clock): [1:1] ?Ending Position 1 (o'clock): [1:0.4] ?Maximum Distance 1 (cm): [1:Yes] [N/A:N/A] ?Undermining: [1:Full Thickness With Exposed Support N/A] ?Classification: [1:Structures Medium] [N/A:N/A] ?Exudate A  mount: [1:Serosanguineous] [N/A:N/A] ?Exudate Type: [1:red, brown] [N/A:N/A] ?Exudate Color: [1:Well defined, not attached] [N/A:N/A] ?Wound Margin: [1:Large (67-100%)] [N/A:N/A] ?Granulation A mount: [1:Red, Pink] [N/A:N/A] ?Granulation Quality: [1:Small (1-33%)] [N/A:N/A] ?Necrotic A mount: ?[1:Fat Layer (Subcutaneous Tissue): Yes N/A] ?Exposed Structures: ?[1:Fascia: No Tendon: No Muscle: No Joint: No Bone: No Small (1-33%)] [N/A:N/A] ?Epithelialization: [1:Debridement - Selective/Open Wound N/A] ?Debridement: ?Pre-procedure Verification/Time Out 08:56 [N/A:N/A] ?Taken: [1:Slough] [N/A:N/A] ?Tissue Debrided: [1:Non-Viable Tissue] [N/A:N/A] ?Level: [1:15.01] [N/A:N/A] ?Debridement A (sq cm): [1:rea Curette] [N/A:N/A] ?Instrument: [1:Minimum] [N/A:N/A] ?Bleeding: [1:Pressure] [N/A:N/A] ?Hemostasis A chieved: [1:0] [N/A:N/A] ?Procedural Pain: [1:0] [N/A:N/A] ?Post Procedural Pain: [1:Procedure was tolerated well] [N/A:N/A] ?Debridement Treatment Response: [1:7.9x1.9x0.3] [N/A:N/A] ?Post Debridement Measurements L x ?W x D (cm) [1:3.537] [N/A:N/A] ?Post Debridement Volume: (cm?) [1:Debridement] [N/A:N/A] ?Treatment Notes ?Electronic  Signature(s) ?Signed: 03/30/2021 9:00:25 AM By: Fredirick Maudlin MD FACS ?Signed: 03/30/2021 4:52:25 PM By: Levan Hurst RN, BSN ?Entered By: Fredirick Maudlin on 03/30/2021 09:00:25 ?-------------------------------------------------------------------------------- ?Multi-Disciplinary Care Plan Details ?Patient Name: ?Date of Service: ?AYDYN, TESTERMAN RD 03/30/2021 8:30 A M ?Medical Record Number: [id number] ?Patient Account Number: [id number] ?Date of Birth/Sex: ?Treating RN: ?Aug 21, 1970 (50 y.o. Janyth Contes ?Primary Care Bryer Cozzolino: PA [place], [place] ?Other Clinician: ?Referring Tashay Bozich: ?Treating Deandra Gadson/Extender: Fredirick Maudlin ?Monica Martinez ?Weeks in Treatment: 3 ?Active Inactive ?Wound/Skin Impairment ?Nursing Diagnoses: ?Impaired tissue integrity ?Knowledge deficit related to ulceration/compromised skin integrity ?Goals: ?Patient/caregiver will verbalize understanding of skin care regimen ?Date Initiated: 03/05/2021 ?Target Resolution Date: 04/06/2021 ?Goal Status: Active ?Ulcer/skin breakdown will have a volume reduction of 30% by week 4 ?Date Initiated: 03/05/2021 ?Date Inactivated: 03/30/2021 ?Target Resolution Date: 04/06/2021 ?Goal Status: Met ?Interventions: ?Assess patient/caregiver ability to obtain necessary supplies ?Assess patient/caregiver ability to perform ulcer/skin care regimen upon admission and as needed ?Assess ulceration(s) every visit ?Provide education on ulcer and skin care ?Notes: ?Electronic Signature(s) ?Signed: 03/30/2021 4:52:25 PM By: Levan Hurst RN, BSN ?Entered By: Levan Hurst on 03/30/2021 08:41:17 ?-------------------------------------------------------------------------------- ?Pain Assessment Details ?Patient Name: ?Date of Service: ?SEUNG, NIDIFFER RD 03/30/2021 8:30 A M ?Medical Record Number: [id number] ?Patient Account Number: [id number] ?Date of Birth/Sex: ?Treating RN: ?1970-11-20 (51 y.o. M) ?Primary Care Joziah Dollins: PA [place], [place] ?Other Clinician: ?Referring  Meah Jiron: ?Treating Kendell Gammon/Extender: Fredirick Maudlin ?Monica Martinez ?Weeks in Treatment: 3 ?Active Problems ?Location of Pain Severity and Description of Pain ?Patient Has Paino No ?Site Locations ?Pain Management and Medic[facility]

## 2021-03-30 NOTE — Progress Notes (Signed)
[name] [name], [name] [name] ([id number]) ?Visit Report for 03/30/2021 ?Chief Complaint Document Details ?Patient Name: Date of Service: ?[name] [name], [name] [name] RD 03/30/2021 8:30 A M ?Medical Record Number: [id number] ?Patient Account Number: [id number] ?Date of Birth/Sex: Treating RN: ?[birth year] (51 y.o. [name] ?Primary Care Provider: PA Zenovia Jordan, NO Other Clinician: ?Referring Provider: ?Treating Provider/Extender: [name] ?[name] ?Weeks in Treatment: 3 ?Information Obtained from: Patient ?Chief Complaint ?ADMISSION: ?03/09/2021: Patient seen for complaints of Non-Healing Wound. ?Electronic Signature(s) ?Signed: 03/30/2021 9:00:35 AM By: [name] MD FACS ?Entered By: [name] on 03/30/2021 09:00:34 ?-------------------------------------------------------------------------------- ?Debridement Details ?Patient Name: Date of Service: ?[name] [name], [name] [name] RD 03/30/2021 8:30 A M ?Medical Record Number: [id number] ?Patient Account Number: [id number] ?Date of Birth/Sex: Treating RN: ?10-16-1970 (51 y.o. [name] ?Primary Care Provider: PA Zenovia Jordan, NO Other Clinician: ?Referring Provider: ?Treating Provider/Extender: [name] ?[name] ?Weeks in Treatment: 3 ?Debridement Performed for Assessment: Wound #1 Right,Medial,Anterior Lower Leg ?Performed By: Physician [name], MD ?Debridement Type: Debridement ?Level of Consciousness (Pre-procedure): Awake and Alert ?Pre-procedure Verification/Time Out Yes - 08:56 ?Taken: ?Start Time: 08:56 ?T Area Debrided (L x W): ?otal 7.9 (cm) x 1.9 (cm) = 15.01 (cm?) ?Tissue and other material debrided: Non-Viable, Slough, Biofilm, Slough ?Level: Non-Viable Tissue ?Debridement Description: Selective/Open Wound ?Instrument: Curette ?Bleeding: Minimum ?Hemostasis Achieved: Pressure ?End Time: 08:58 ?Procedural Pain: 0 ?Post Procedural Pain: 0 ?Response to Treatment: Procedure was tolerated well ?Level of Consciousness (Post- Awake and  Alert ?procedure): ?Post Debridement Measurements of Total Wound ?Length: (cm) 7.9 ?Width: (cm) 1.9 ?Depth: (cm) 0.3 ?Volume: (cm?) 3.537 ?Character of Wound/Ulcer Post Debridement: Improved ?Post Procedure Diagnosis ?Same as Pre-procedure ?Electronic Signature(s) ?Signed: 03/30/2021 11:43:08 AM By: [name] MD FACS ?Signed: 03/30/2021 4:52:25 PM By: Zandra Abts RN, BSN ?Entered By: Zandra Abts on 03/30/2021 08:57:57 ?-------------------------------------------------------------------------------- ?HPI Details ?Patient Name: Date of Service: ?[name] [name], [name] [name] RD 03/30/2021 8:30 A M ?Medical Record Number: [id number] ?Patient Account Number: [id number] ?Date of Birth/Sex: Treating RN: ?15-Sep-1970 (51 y.o. [name] ?Primary Care Provider: PA Zenovia Jordan, NO Other Clinician: ?Referring Provider: ?Treating Provider/Extender: [name] ?[name] ?Weeks in Treatment: 3 ?History of Present Illness ?HPI Description: ADMISSION ?03/05/2021: This is a 52 year old man who was injured at work when a metal bar struck his right lower leg. He presented to the emergency department at [facility]long hospital where he underwent radiographic imaging that did not show a fracture. The laceration was sutured and the patient was discharged from the ?emergency room. He returned to the emergency room to weeks later with separation of his wound. At that time, no additional studies were performed, but he ?was placed on a course of antibiotics (doxycycline and Keflex). He completed those about a week ago. He has continued to have significant pain as well as ?drainage from the site. He has been applying triple antibiotic ointment and a nonstick gauze to the site without significant improvement. He has not experienced ?any fevers or chills. There is some drainage, but significantly less than prior, per his report. He is accompanied by his son today. ?He is otherwise healthy without any significant medical history. He takes  no prescribed home medications. ?CLINICAL DATA: Laceration, hit with metal bar ?EXAM: ?RIGHT TIBIA AND FIBULA - 2 VIEW ?COMPARISON: 06/26/2018 ?FINDINGS: ?Frontal and lateral views of the right tibia and fibula are ?obtained. No acute displaced fracture. Right knee and ankle are well ?aligned. Mild soft tissue swelling involving the anterolateral right ?lower leg. No radiopaque foreign body. ?IMPRESSION: ?1. Mild soft tissue swelling. No  fracture or radiopaque foreign ?body. ?No labs were performed at either emergency department evaluation. ?03/09/2021: The wound is slightly smaller today. He still has some tenderness associated with it. Still with some drainage. Culture taken on Monday returned with ?a very small amount of Escherichia coli, sensitive to gentamicin. He is currently getting topical gentamicin ointment with Iodoflex. He is not in compression. ?03/15/2021: Continued improvement in the wound. Granulation tissue beginning to fill in. Still with some drainage. Still fairly tender, but without signs of significant ?infection. Still with topical gentamicin and Iodoflex with Kerlix and Coban wraps. ?03/23/2021: There is great granulation tissue filling in and covering the tibia now. Slough is significantly decreased. Periwound is the most tender area for him. ?03/30/2021: The wound continues to heal nicely. Granulation tissue has filled nearly the entire depth of the wound at the caudal aspect. Very little slough. ?Significant improvement in pain. ?Electronic Signature(s) ?Signed: 03/30/2021 9:01:16 AM By: [name] MD FACS ?Entered By: [name] on 03/30/2021 09:01:16 ?-------------------------------------------------------------------------------- ?Physical Exam Details ?Patient Name: Date of Service: ?[name] [name], [name] [name] RD 03/30/2021 8:30 A M ?Medical Record Number: [id number] ?Patient Account Number: [id number] ?Date of Birth/Sex: Treating RN: ?July 19, 1970 (51 y.o. [name] ?Primary Care Provider: PA  Zenovia Jordan, NO Other Clinician: ?Referring Provider: ?Treating Provider/Extender: [name] ?[name] ?Weeks in Treatment: 3 ?Constitutional ?. . . . No acute distress. ?Respiratory ?Normal work of breathing on room air. ?Notes ?03/30/2021: Wound evaluationthe tunnel depth is significantly shorter. There is granulation tissue filling almost the entire depth of the wound at the distal ?aspect and it is continuing to fill in the more proximal portion. Very little slough. Periwound erythema no longer present and tenderness greatly improved. ?Electronic Signature(s) ?Signed: 03/30/2021 9:02:35 AM By: [name] MD FACS ?Entered By: [name] on 03/30/2021 09:02:35 ?-------------------------------------------------------------------------------- ?Physician Orders Details ?Patient Name: Date of Service: ?[name] [name], [name] [name] RD 03/30/2021 8:30 A M ?Medical Record Number: [id number] ?Patient Account Number: [id number] ?Date of Birth/Sex: Treating RN: ?November 13, 1970 (51 y.o. 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 - Dr. Lady Gary - Room 2 ?Return A ?Bathing/ Shower/ Hygiene ?May shower with protection but do not get wound dressing(s) wet. - Ok to use Chartered loss adjuster, can purchase at [facility], [facility], or [employer] ?Edema Control - Lymphedema / SCD / Other ?Right Lower Extremity ?Elevate legs to the level of the heart or above for 30 minutes daily and/or when sitting, a frequency of: - Throughout the day ?Avoid standing for long periods of time. ?Wound Treatment ?Wound #1 - Lower Leg Wound Laterality: Right, Medial, Anterior ?Cleanser: Soap and Water 1 x Per Week/30  Days ?Discharge Instructions: May shower and wash wound with dial antibacterial soap and water prior to dressing change. ?Cleanser: Wound Cleanser 1 x Per Week/30 Days ?Discharge Instructions: Cleanse the wound wit

## 2021-04-06 ENCOUNTER — Encounter (HOSPITAL_BASED_OUTPATIENT_CLINIC_OR_DEPARTMENT_OTHER): Payer: Self-pay | Admitting: General Surgery

## 2021-04-06 NOTE — Progress Notes (Addendum)
[name] [name], [name] [name] ([id number]) ?Visit Report for 04/06/2021 ?Arrival Information Details ?Patient Name: Date of Service: ?[name] [name], [name] [name] 04/06/2021 9:15 A M ?Medical Record Number: [id number] ?Patient Account Number: [id number] ?Date of Birth/Sex: Treating RN: ?05-19-1970 (51 y.o. M) ?Primary Care Macgregor Aeschliman: [place] Prophet, [place] Other Clinician: ?Referring Bernon Arviso: ?Treating Zavien Clubb/Extender: Fredirick Maudlin ?Monica Martinez ?Weeks in Treatment: 4 ?Visit Information History Since Last Visit ?Added or deleted any medications: No ?Patient Arrived: Ambulatory ?Any new allergies or adverse reactions: No ?Arrival Time: 09:22 ?Had a fall or experienced change in No ?Accompanied By: self ?activities of daily living that may affect ?Transfer Assistance: None ?risk of falls: ?Patient Identification Verified: Yes ?Signs or symptoms of abuse/neglect since last visito No ?Secondary Verification Process Completed: Yes ?Hospitalized since last visit: No ?Patient Has Alerts: No ?Implantable device outside of the clinic excluding No ?cellular tissue based products placed in the center ?since last visit: ?Has Dressing in Place as Prescribed: Yes ?Pain Present Now: No ?Electronic Signature(s) ?Signed: 04/06/2021 9:33:54 AM By: Sandre Kitty ?Entered By: Sandre Kitty on 04/06/2021 09:22:49 ?-------------------------------------------------------------------------------- ?Encounter Discharge Information Details ?Patient Name: Date of Service: ?[name] [name], [name] [name] 04/06/2021 9:15 A M ?Medical Record Number: [id number] ?Patient Account Number: [id number] ?Date of Birth/Sex: Treating RN: ?Jul 18, 1970 (51 y.o. Janyth Contes ?Primary Care Bianka Liberati: [place] Prophet, [place] Other Clinician: ?Referring Nerida Boivin: ?Treating Stacyann Mcconaughy/Extender: Fredirick Maudlin ?Monica Martinez ?Weeks in Treatment: 4 ?Encounter Discharge Information Items Post Procedure Vitals ?Discharge Condition: Stable ?Temperature (F): 97.9 ?Ambulatory Status: Ambulatory ?Pulse (bpm):  75 ?Discharge Destination: Home ?Respiratory Rate (breaths/min): 18 ?Transportation: Private Auto ?Blood Pressure (mmHg): 159/87 ?Accompanied By: alone ?Schedule Follow-up Appointment: Yes ?Clinical Summary of Care: Patient Declined ?Electronic Signature(s) ?Signed: 04/06/2021 4:27:25 PM By: Levan Hurst RN, BSN ?Entered By: Levan Hurst on 04/06/2021 15:45:56 ?-------------------------------------------------------------------------------- ?Lower Extremity Assessment Details ?Patient Name: ?Date of Service: ?[name] [name], [name] [name] 04/06/2021 9:15 A M ?Medical Record Number: [id number] ?Patient Account Number: [id number] ?Date of Birth/Sex: ?Treating RN: ?05-12-1970 (51 y.o. Janyth Contes ?Primary Care Meryem Haertel: PA [place], [place] ?Other Clinician: ?Referring Travus Oren: ?Treating Karinda Cabriales/Extender: Fredirick Maudlin ?Monica Martinez ?Weeks in Treatment: 4 ?Edema Assessment ?Assessed: [Left: No] [Right: No] ?E[Left: dema] [Right: :] ?Calf ?Left: Right: ?Point of Measurement: 33 cm From Medial Instep 31.5 cm ?Ankle ?Left: Right: ?Point of Measurement: 10 cm From Medial Instep 18 cm ?Vascular Assessment ?Pulses: ?Dorsalis Pedis ?Palpable: [Right:Yes] ?Electronic Signature(s) ?Signed: 04/06/2021 4:27:25 PM By: Levan Hurst RN, BSN ?Entered By: Levan Hurst on 04/06/2021 09:38:22 ?-------------------------------------------------------------------------------- ?Multi Wound Chart Details ?Patient Name: ?Date of Service: ?[name] [name], [name] [name] 04/06/2021 9:15 A M ?Medical Record Number: [id number] ?Patient Account Number: [id number] ?Date of Birth/Sex: ?Treating RN: ?1970/11/15 (51 y.o. M) ?Primary Care Dontae Minerva: PA [place], [place] ?Other Clinician: ?Referring Dale Ribeiro: ?Treating Asees Manfredi/Extender: Fredirick Maudlin ?Monica Martinez ?Weeks in Treatment: 4 ?Vital Signs ?Height(in): 71 ?Pulse(bpm): 75 ?Weight(lbs): 165 ?Blood Pressure(mmHg): 159/87 ?Body Mass Index(BMI): 23 ?Temperature(??F): 97.9 ?Respiratory Rate(breaths/min):  18 ?Photos: [1:Right, Medial, Anterior Lower Leg] [N/A:N/A N/A] ?Wound Location: [1:Trauma] [N/A:N/A] ?Wounding Event: [1:Trauma, Other] [N/A:N/A] ?Primary Etiology: [1:01/19/2021] [N/A:N/A] ?Date Acquired: [1:4] [N/A:N/A] ?Weeks of Treatment: [1:Open] [N/A:N/A] ?Wound Status: [1:No] [N/A:N/A] ?Wound Recurrence: [1:7.9x1.7x0.2] [N/A:N/A] ?Measurements L x W x D (cm) [1:10.548] [N/A:N/A] ?A (cm?) : ?rea [1:2.11] [N/A:N/A] ?Volume (cm?) : [1:25.40%] [N/A:N/A] ?% Reduction in A rea: [1:78.70%] [N/A:N/A] ?% Reduction in Volume: [1:Full Thickness With Exposed Support N/A] ?Classification: [1:Structures Medium] [N/A:N/A] ?Exudate A mount: [1:Serosanguineous] [N/A:N/A] ?Exudate Type: [1:red, brown] [N/A:N/A] ?Exudate Color: [1:Well defined, not attached] [N/A:N/A] ?Wound Margin: [1:Large (67-100%)] [N/A:N/A] ?  Granulation A mount: [1:Red, Pink] [N/A:N/A] ?Granulation Quality: [1:Small (1-33%)] [N/A:N/A] ?Necrotic A mount: ?[1:Fat Layer (Subcutaneous Tissue): Yes N/A] ?Exposed Structures: ?[1:Fascia: No Tendon: No Muscle: No Joint: No Bone: No Small (1-33%)] [N/A:N/A] ?Epithelialization: [1:Debridement - Selective/Open Wound N/A] ?Debridement: ?Pre-procedure Verification/Time Out 09:42 [N/A:N/A] ?Taken: [1:Slough] [N/A:N/A] ?Tissue Debrided: [1:Non-Viable Tissue] [N/A:N/A] ?Level: [1:13.43] [N/A:N/A] ?Debridement A (sq cm): [1:rea Curette] [N/A:N/A] ?Instrument: [1:Minimum] [N/A:N/A] ?Bleeding: [1:Pressure] [N/A:N/A] ?Hemostasis A chieved: [1:0] [N/A:N/A] ?Procedural Pain: [1:0] [N/A:N/A] ?Post Procedural Pain: [1:Procedure was tolerated well] [N/A:N/A] ?Debridement Treatment Response: [1:7.9x1.7x0.2] [N/A:N/A] ?Post Debridement Measurements L x ?W x D (cm) [1:2.11] [N/A:N/A] ?Post Debridement Volume: (cm?) [1:Debridement] [N/A:N/A] ?Treatment Notes ?Electronic Signature(s) ?Signed: 04/06/2021 9:44:54 AM By: Fredirick Maudlin MD FACS ?Entered By: Fredirick Maudlin on 04/06/2021  09:44:53 ?-------------------------------------------------------------------------------- ?Multi-Disciplinary Care Plan Details ?Patient Name: ?Date of Service: ?[name] [name], [name] [name] 04/06/2021 9:15 A M ?Medical Record Number: [id number] ?Patient Account Number: [id number] ?Date of Birth/Sex: ?Treating RN: ?04-22-1970 (51 y.o. Janyth Contes ?Primary Care Korey Prashad: [place], [place] ?Other Clinician: ?Referring Morgann Woodburn: ?Treating Kylene Zamarron/Extender: Fredirick Maudlin ?Monica Martinez ?Weeks in Treatment: 4 ?Active Inactive ?Wound/Skin Impairment ?Nursing Diagnoses: ?Impaired tissue integrity ?Knowledge deficit related to ulceration/compromised skin integrity ?Goals: ?Patient/caregiver will verbalize understanding of skin care regimen ?Date Initiated: 03/05/2021 ?Target Resolution Date: 05/04/2021 ?Goal Status: Active ?Ulcer/skin breakdown will have a volume reduction of 30% by week 4 ?Date Initiated: 03/05/2021 ?Date Inactivated: 03/30/2021 ?Target Resolution Date: 04/06/2021 ?Goal Status: Met ?Interventions: ?Assess patient/caregiver ability to obtain necessary supplies ?Assess patient/caregiver ability to perform ulcer/skin care regimen upon admission and as needed ?Assess ulceration(s) every visit ?Provide education on ulcer and skin care ?Notes: ?Electronic Signature(s) ?Signed: 04/06/2021 4:27:25 PM By: Levan Hurst RN, BSN ?Entered By: Levan Hurst on 04/06/2021 09:41:23 ?-------------------------------------------------------------------------------- ?Pain Assessment Details ?Patient Name: ?Date of Service: ?[name] [name], [name] [name] 04/06/2021 9:15 A M ?Medical Record Number: [id number] ?Patient Account Number: [id number] ?Date of Birth/Sex: ?Treating RN: ?1970/02/12 (51 y.o. M) ?Primary Care Chaz Ronning: [place], [place] ?Other Clinician: ?Referring Athina Fahey: ?Treating Delynn Pursley/Extender: Fredirick Maudlin ?Monica Martinez ?Weeks in Treatment: 4 ?Active Problems ?Location of Pain Severity and Description of Pain ?Patient Has Paino  No ?Site Locations ?Pain Management and Medication ?Current Pain Management: ?Electronic Signature(s) ?Signed: 04/06/2021 9:33:54 AM By: Sandre Kitty ?Entered By: Sandre Kitty on 04/06/2021 81:15:72 ?--------------------------------------------------------

## 2021-04-06 NOTE — Progress Notes (Signed)
[name] [name], [name] [name] ([id number]) ?Visit Report for 04/06/2021 ?Chief Complaint Document Details ?Patient Name: Date of Service: ?[name] [name], [name] [name] RD 04/06/2021 9:15 A M ?Medical Record Number: [id number] ?Patient Account Number: [id number] ?Date of Birth/Sex: Treating RN: ?1970/04/18 (51 y.o. M) ?Primary Care Provider: PA Haig Prophet, [place] Other Clinician: ?Referring Provider: ?Treating Provider/Extender: Fredirick Maudlin ?Monica Martinez ?Weeks in Treatment: 4 ?Information Obtained from: Patient ?Chief Complaint ?ADMISSION: ?03/09/2021: Patient seen for complaints of Non-Healing Wound. ?Electronic Signature(s) ?Signed: 04/06/2021 9:45:01 AM By: Fredirick Maudlin MD FACS ?Entered By: Fredirick Maudlin on 04/06/2021 09:45:01 ?-------------------------------------------------------------------------------- ?Debridement Details ?Patient Name: Date of Service: ?[name] [name], [name] [name] RD 04/06/2021 9:15 A M ?Medical Record Number: [id number] ?Patient Account Number: [id number] ?Date of Birth/Sex: Treating RN: ?[birth year] (51 y.o. Janyth Contes ?Primary Care Provider: PA Haig Prophet, [place] Other Clinician: ?Referring Provider: ?Treating Provider/Extender: Fredirick Maudlin ?Monica Martinez ?Weeks in Treatment: 4 ?Debridement Performed for Assessment: Wound #1 Right,Medial,Anterior Lower Leg ?Performed By: Physician Fredirick Maudlin, MD ?Debridement Type: Debridement ?Level of Consciousness (Pre-procedure): Awake and Alert ?Pre-procedure Verification/Time Out Yes - 09:42 ?Taken: ?Start Time: 09:42 ?T Area Debrided (L x W): ?otal 7.9 (cm) x 1.7 (cm) = 13.43 (cm?) ?Tissue and other material debrided: Non-Viable, [place], [place] ?Level: Non-Viable Tissue ?Debridement Description: Selective/Open Wound ?Instrument: Curette ?Bleeding: Minimum ?Hemostasis Achieved: Pressure ?End Time: 09:43 ?Procedural Pain: 0 ?Post Procedural Pain: 0 ?Response to Treatment: Procedure was tolerated well ?Level of Consciousness (Post- Awake and Alert ?procedure): ?Post Debridement  Measurements of Total Wound ?Length: (cm) 7.9 ?Width: (cm) 1.7 ?Depth: (cm) 0.2 ?Volume: (cm?) 2.11 ?Character of Wound/Ulcer Post Debridement: Improved ?Post Procedure Diagnosis ?Same as Pre-procedure ?Electronic Signature(s) ?Signed: 04/06/2021 12:08:53 PM By: Fredirick Maudlin MD FACS ?Signed: 04/06/2021 4:27:25 PM By: Levan Hurst RN, BSN ?Entered By: Levan Hurst on 04/06/2021 09:43:23 ?-------------------------------------------------------------------------------- ?HPI Details ?Patient Name: Date of Service: ?[name] [name], [name] [name] RD 04/06/2021 9:15 A M ?Medical Record Number: [id number] ?Patient Account Number: [id number] ?Date of Birth/Sex: Treating RN: ?1970-09-24 (51 y.o. M) ?Primary Care Provider: PA Haig Prophet, [place] Other Clinician: ?Referring Provider: ?Treating Provider/Extender: Fredirick Maudlin ?Monica Martinez ?Weeks in Treatment: 4 ?History of Present Illness ?HPI Description: ADMISSION ?03/05/2021: This is a 51 year old man who was injured at work when a metal bar struck his right lower leg. He presented to the emergency department at [facility]long hospital where he underwent radiographic imaging that did not show a fracture. The laceration was sutured and the patient was discharged from the ?emergency room. He returned to the emergency room to weeks later with separation of his wound. At that time, no additional studies were performed, but he ?was placed on a course of antibiotics (doxycycline and Keflex). He completed those about a week ago. He has continued to have significant pain as well as ?drainage from the site. He has been applying triple antibiotic ointment and a nonstick gauze to the site without significant improvement. He has not experienced ?any fevers or chills. There is some drainage, but significantly less [name] [name] prior, per his report. He is accompanied by his son today. ?He is otherwise healthy without any significant medical history. He takes no prescribed home medications. ?CLINICAL DATA:  Laceration, hit with metal bar ?EXAM: ?RIGHT TIBIA AND FIBULA - 2 VIEW ?COMPARISON: 06/26/2018 ?FINDINGS: ?Frontal and lateral views of the right tibia and fibula are ?obtained. No acute displaced fracture. Right knee and ankle are well ?aligned. Mild soft tissue swelling involving the anterolateral right ?lower leg. No radiopaque foreign body. ?IMPRESSION: ?1. Mild soft tissue swelling. No fracture or radiopaque foreign ?body. ?  No labs were performed at either emergency department evaluation. ?03/09/2021: The wound is slightly smaller today. He still has some tenderness associated with it. Still with some drainage. Culture taken on Monday returned with ?a very small amount of Escherichia coli, sensitive to gentamicin. He is currently getting topical gentamicin ointment with Iodoflex. He is not in compression. ?03/15/2021: Continued improvement in the wound. Granulation tissue beginning to fill in. Still with some drainage. Still fairly tender, but without signs of significant ?infection. Still with topical gentamicin and Iodoflex with Kerlix and Coban wraps. ?03/23/2021: There is great granulation tissue filling in and covering the tibia now. Slough is significantly decreased. Periwound is the most tender area for him. ?03/30/2021: The wound continues to heal nicely. Granulation tissue has filled nearly the entire depth of the wound at the caudal aspect. Very little slough. ?Significant improvement in pain. ?04/06/2021: His wound continues to heal. The granulation tissue continues to fill and the tunnel is closed. Almost no slough present and his pain is essentially nil. ?We have been using silver collagen. ?Electronic Signature(s) ?Signed: 04/06/2021 9:45:56 AM By: Fredirick Maudlin MD FACS ?Entered By: Fredirick Maudlin on 04/06/2021 09:45:56 ?-------------------------------------------------------------------------------- ?Physical Exam Details ?Patient Name: ?Date of Service: ?[name] [name], [name] [name] RD 04/06/2021 9:15 A M ?Medical  Record Number: [id number] ?Patient Account Number: [id number] ?Date of Birth/Sex: ?Treating RN: ?1970/04/11 (51 y.o. M) ?Primary Care Provider: PA [place], [place] ?Other Clinician: ?Referring Provider: ?Treating Provider/Extender: Fredirick Maudlin ?Monica Martinez ?Weeks in Treatment: 4 ?Constitutional ?He is hypertensive, but asymptomatic.. . . . No acute distress. ?Respiratory ?Normal work of breathing on room air. ?Notes ?04/06/2021: Wound evaluationthe tunnel is completely closed. The wound continues to fill with very healthy-appearing granulation tissue. Very minimal slough. ?Almost no pain. ?Electronic Signature(s) ?Signed: 04/06/2021 9:48:03 AM By: Fredirick Maudlin MD FACS ?Entered By: Fredirick Maudlin on 04/06/2021 09:48:03 ?-------------------------------------------------------------------------------- ?Physician Orders Details ?Patient Name: ?Date of Service: ?[name] [name], [name] [name] RD 04/06/2021 9:15 A M ?Medical Record Number: [id number] ?Patient Account Number: [id number] ?Date of Birth/Sex: ?Treating RN: ?1970-07-19 (51 y.o. 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 - Dr. Celine Ahr - Room 2 ?Return A ?Bathing/ Shower/ Hygiene ?May shower with protection but do not get wound dressing(s) wet. - Ok to use Biomedical engineer, can purchase at [facility], [facility], or [facility] ?Edema Control - Lymphedema / SCD / Other ?Right Lower Extremity ?Elevate legs to the level of the heart or above for 30 minutes daily and/or when sitting, a frequency of: - Throughout the day ?Avoid standing for long periods of time. ?Wound Treatment ?Wound #1 - Lower Leg Wound Laterality: Right, Medial,  Anterior ?Cleanser: Soap and Water 1 x Per Week/30 Days ?Discharge Instructions: May shower and wash wound with dial antibacterial soap and water prior to dressing change. ?Cleanser: Wound Cleanser 1 x Per Week/30

## 2021-04-13 ENCOUNTER — Encounter (HOSPITAL_BASED_OUTPATIENT_CLINIC_OR_DEPARTMENT_OTHER): Payer: Self-pay | Attending: General Surgery | Admitting: General Surgery

## 2021-04-13 DIAGNOSIS — L97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: Secondary | ICD-10-CM | POA: Insufficient documentation

## 2021-04-13 NOTE — Progress Notes (Signed)
BELDON, NOWLING ([id number]) ?Visit Report for 04/13/2021 ?Chief Complaint Document Details ?Patient Name: Date of Service: ?[name] [name], [name] [name] 04/13/2021 8:30 A M ?Medical Record Number: [id number] ?Patient Account Number: [id number] ?Date of Birth/Sex: Treating RN: ?December 31, 1970 (51 y.o. Male) Zandra Abts ?Primary Care Provider: PA Zenovia Jordan, NO Other Clinician: ?Referring Provider: ?Treating Provider/Extender: [name] ?[name] ?Weeks in Treatment: 5 ?Information Obtained from: Patient ?Chief Complaint ?ADMISSION: ?03/09/2021: Patient seen for complaints of Non-Healing Wound. ?Electronic Signature(s) ?Signed: 04/13/2021 9:01:11 AM By: [name] MD FACS ?Entered By: [name] on 04/13/2021 09:01:10 ?-------------------------------------------------------------------------------- ?Debridement Details ?Patient Name: Date of Service: ?[name], [name] [name] 04/13/2021 8:30 A M ?Medical Record Number: [id number] ?Patient Account Number: [id number] ?Date of Birth/Sex: Treating RN: ?November 16, 1970 (51 y.o. Male) Baruch Merl, Randa Evens ?Primary Care Provider: PA Zenovia Jordan, NO Other Clinician: ?Referring Provider: ?Treating Provider/Extender: [name] ?[name] ?Weeks in Treatment: 5 ?Debridement Performed for Assessment: Wound #1 Right,Medial,Anterior Lower Leg ?Performed By: Physician [name], MD ?Debridement Type: Debridement ?Level of Consciousness (Pre-procedure): Awake and Alert ?Pre-procedure Verification/Time Out Yes - 08:56 ?Taken: ?Start Time: 08:56 ?Pain Control: ?Other : Benzocaine 20% ?T Area Debrided (L x W): ?otal 7.8 (cm) x 1.2 (cm) = 9.36 (cm?) ?Tissue and other material debrided: Non-Viable, Slough, Subcutaneous, Slough ?Level: Skin/Subcutaneous Tissue ?Debridement Description: Excisional ?Instrument: Curette ?Bleeding: Minimum ?Hemostasis Achieved: Pressure ?End Time: 08:57 ?Procedural Pain: 0 ?Post Procedural Pain: 0 ?Response to Treatment: Procedure was tolerated well ?Level of  Consciousness (Post- Awake and Alert ?procedure): ?Post Debridement Measurements of Total Wound ?Length: (cm) 7.8 ?Width: (cm) 1.2 ?Depth: (cm) 0.2 ?Volume: (cm?) 1.47 ?Character of Wound/Ulcer Post Debridement: Improved ?Post Procedure Diagnosis ?Same as Pre-procedure ?Electronic Signature(s) ?Unsigned ?Entered By: Karie Schwalbe on 04/13/2021 09:01:38 ?-------------------------------------------------------------------------------- ?HPI Details ?Patient Name: Date of Service: ?[name], [name] [name] 04/13/2021 8:30 A M ?Medical Record Number: [id number] ?Patient Account Number: [id number] ?Date of Birth/Sex: Treating RN: ?05-28-1970 (51 y.o. Male) Zandra Abts ?Primary Care Provider: PA Zenovia Jordan, NO Other Clinician: ?Referring Provider: ?Treating Provider/Extender: [name] ?[name] ?Weeks in Treatment: 5 ?History of Present Illness ?HPI Description: ADMISSION ?03/05/2021: This is a 51 year old man who was injured at work when a metal bar struck his right lower leg. He presented to the emergency department at [facility]long hospital where he underwent radiographic imaging that did not show a fracture. The laceration was sutured and the patient was discharged from the ?emergency room. He returned to the emergency room to weeks later with separation of his wound. At that time, no additional studies were performed, but he ?was placed on a course of antibiotics (doxycycline and Keflex). He completed those about a week ago. He has continued to have significant pain as well as ?drainage from the site. He has been applying triple antibiotic ointment and a nonstick gauze to the site without significant improvement. He has not experienced ?any fevers or chills. There is some drainage, but significantly less than prior, per his report. He is accompanied by his son today. ?He is otherwise healthy without any significant medical history. He takes no prescribed home medications. ?CLINICAL DATA: Laceration, hit with  metal bar ?EXAM: ?RIGHT TIBIA AND FIBULA - 2 VIEW ?COMPARISON: 06/26/2018 ?FINDINGS: ?Frontal and lateral views of the right tibia and fibula are ?obtained. No acute displaced fracture. Right knee and ankle are well ?aligned. Mild soft tissue swelling involving the anterolateral right ?lower leg. No radiopaque foreign body. ?IMPRESSION: ?1. Mild soft tissue swelling. No fracture or radiopaque foreign ?body. ?No labs were performed at either emergency  department evaluation. ?03/09/2021: The wound is slightly smaller today. He still has some tenderness associated with it. Still with some drainage. Culture taken on Monday returned with ?a very small amount of Escherichia coli, sensitive to gentamicin. He is currently getting topical gentamicin ointment with Iodoflex. He is not in compression. ?03/15/2021: Continued improvement in the wound. Granulation tissue beginning to fill in. Still with some drainage. Still fairly tender, but without signs of significant ?infection. Still with topical gentamicin and Iodoflex with Kerlix and Coban wraps. ?03/23/2021: There is great granulation tissue filling in and covering the tibia now. Slough is significantly decreased. Periwound is the most tender area for him. ?03/30/2021: The wound continues to heal nicely. Granulation tissue has filled nearly the entire depth of the wound at the caudal aspect. Very little slough. ?Significant improvement in pain. ?04/06/2021: His wound continues to heal. The granulation tissue continues to fill and the tunnel is closed. Almost no slough present and his pain is essentially nil. ?We have been using silver collagen. ?04/13/2021: The wound is healing very nicely. At the inferior portion, the granulation tissue has filled to the point that the opening is flush with the surface. Very ?minimal slough. No pain. We have been using Prisma silver collagen. ?Electronic Signature(s) ?Signed: 04/13/2021 9:01:54 AM By: [name] MD FACS ?Entered By: [name] on 04/13/2021 09:01:54 ?-------------------------------------------------------------------------------- ?Physical Exam Details ?Patient Name: Date of Service: ?[name], [name] [name] 04/13/2021 8:30 A M ?Medical Record Number: [id number] ?Patient Account Number: [id number] ?Date of Birth/Sex: Treating RN: ?02/27/1970 (51 y.o. Male) Zandra Abts ?Primary Care Provider: PA Zenovia Jordan, NO Other Clinician: ?Referring Provider: ?Treating Provider/Extender: [name] ?[name] ?Weeks in Treatment: 5 ?Constitutional ?. . . . No acute distress. ?Respiratory ?Normal work of breathing on room air. ?Notes ?04/13/2021: The wound is smaller today and portions are flush with the surrounding skin surface. There is a healthy base of granulation tissue and very minimal ?slough. ?Electronic Signature(s) ?Signed: 04/13/2021 9:02:53 AM By: [name] MD FACS ?Entered By: [name] on 04/13/2021 09:02:53 ?-------------------------------------------------------------------------------- ?Physician Orders Details ?Patient Name: Date of Service: ?[name], [name] [name] 04/13/2021 8:30 A M ?Medical Record Number: [id number] ?Patient Account Number: [id number] ?Date of Birth/Sex: Treating RN: ?[birth year] (51 y.o. 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 - Dr. Lady Gary - Room 2 ?Return A ?Bathing/ Shower/ Hygiene ?May shower with protection but do not get wound dressing(s) wet. - Ok to use Chartered loss adjuster, can purchase at [facility], [facility], or [employer] ?Edema Control - Lymphedema / SCD / Other ?Right Lower Extremity ?Elevate legs to the level of the heart or above for 30 minutes  daily and/or when sitting, a frequency of: - Throughout the day ?Avoid standing for long periods of time. ?Wound Treatment ?Wound #1 - Lower Leg Wound Laterality: Right, Medial, Anterior ?Cleanser: Soap and Wate

## 2021-04-20 ENCOUNTER — Encounter (HOSPITAL_BASED_OUTPATIENT_CLINIC_OR_DEPARTMENT_OTHER): Payer: Self-pay | Admitting: General Surgery

## 2021-04-20 NOTE — Progress Notes (Addendum)
[name] [name], [name] ([id number]) ?Visit Report for [name] [name] ?Chief Complaint Document Details ?Patient Name: Date of Service: ?[name], [name] [name] [name] 8:30 A M ?Medical Record Number: [id number] ?Patient Account Number: [id number] ?Date of Birth/Sex: Treating RN: ?Jul 17, 1970 (51 y.o. [name] [name] [name] ?Primary Care Provider: PA [name] [name] [name], NO Other Clinician: ?Referring Provider: ?Treating Provider/Extender: [name] [name] [name] ?[name] ?Weeks in Treatment: 6 ?Information Obtained from: Patient ?Chief Complaint ?ADMISSION: ?03/09/2021: Patient seen for complaints of Non-Healing Wound. ?Electronic Signature(s) ?Signed: 04/20/2021 8:55:44 AM By: [name] Guess MD FACS ?Entered By: [name] [name] [name] on 04/20/2021 08:55:44 ?-------------------------------------------------------------------------------- ?Debridement Details ?Patient Name: Date of Service: ?[name], [name] [name] [name] [name] [name] 8:30 A M ?Medical Record Number: [id number] ?Patient Account Number: [id number] ?Date of Birth/Sex: Treating RN: ?24-Apr-1970 (51 y.o. [name] [name] [name] ?Primary Care Provider: PA [name] [name] [name], NO Other Clinician: ?Referring Provider: ?Treating Provider/Extender: [name] [name] [name] ?[name] ?Weeks in Treatment: 6 ?Debridement Performed for Assessment: Wound #1 Right,Medial,Anterior Lower Leg ?Performed By: Physician [name] Guess, MD ?Debridement Type: Debridement ?Level of Consciousness (Pre-procedure): Awake and Alert ?Pre-procedure Verification/Time Out Yes - 08:51 ?Taken: ?Start Time: 08:51 ?Pain Control: ?Other : Benzocaine 20% spray ?T Area Debrided (L x W): ?otal 6.5 (cm) x 1 (cm) = 6.5 (cm?) ?Tissue and other material debrided: Non-Viable, Slough, Subcutaneous, Slough ?Level: Skin/Subcutaneous Tissue ?Debridement Description: Excisional ?Instrument: Curette ?Bleeding: Minimum ?Hemostasis Achieved: Pressure ?Procedural Pain: 0 ?Post Procedural Pain: 0 ?Response to Treatment: Procedure was tolerated well ?Level of Consciousness (Post-  Awake and Alert ?procedure): ?Post Debridement Measurements of Total Wound ?Length: (cm) 7 ?Width: (cm) 1.3 ?Depth: (cm) 0.2 ?Volume: (cm?) 1.429 ?Character of Wound/Ulcer Post Debridement: Improved ?Post Procedure Diagnosis ?Same as Pre-procedure ?Electronic Signature(s) ?Signed: 04/20/2021 12:55:19 PM By: [name] Guess MD FACS ?Signed: 04/25/2021 4:54:39 PM By: [name] Pulling RN, BSN ?Entered By: [name] [name] [name] on 04/20/2021 08:53:46 ?-------------------------------------------------------------------------------- ?HPI Details ?Patient Name: Date of Service: ?[name] [name] [name], [name] [name] [name] 8:30 A M ?Medical Record Number: [id number] ?Patient Account Number: [id number] ?Date of Birth/Sex: Treating RN: ?1970/05/02 (51 y.o. [name] [name] [name] ?Primary Care Provider: PA [name] [name] [name], NO Other Clinician: ?Referring Provider: ?Treating Provider/Extender: [name] [name] [name] ?[name] ?Weeks in Treatment: 6 ?History of Present Illness ?HPI Description: ADMISSION ?03/05/2021: This is a 51 year old man who was injured at work when a metal bar struck his right lower leg. He presented to the emergency department at [facility] hospital where he underwent radiographic imaging that did not show a fracture. The laceration was sutured and the patient was discharged from the ?emergency room. He returned to the emergency room to weeks later with separation of his wound. At that time, no additional studies were performed, but he ?was placed on a course of antibiotics (doxycycline and Keflex). He completed those about a week ago. He has continued to have significant pain as well as ?drainage from the site. He has been applying triple antibiotic ointment and a nonstick gauze to the site without significant improvement. He has not experienced ?any fevers or chills. There is some drainage, but significantly less than prior, per his report. He is accompanied by his son today. ?He is otherwise healthy without any significant medical history.  He takes no prescribed home medications. ?CLINICAL DATA: Laceration, hit with metal bar ?EXAM: ?RIGHT TIBIA AND FIBULA - 2 VIEW ?COMPARISON: 06/26/2018 ?FINDINGS: ?Frontal and lateral views of the right tibia and fibula are ?obtained. No acute displaced fracture. Right knee and ankle are well ?aligned. Mild soft tissue swelling involving the anterolateral right ?lower leg. No radiopaque foreign body. ?IMPRESSION: ?1. Mild soft  tissue swelling. No fracture or radiopaque foreign ?body. ?No labs were performed at either emergency department evaluation. ?03/09/2021: The wound is slightly smaller today. He still has some tenderness associated with it. Still with some drainage. Culture taken on Monday returned with ?a very small amount of Escherichia coli, sensitive to gentamicin. He is currently getting topical gentamicin ointment with Iodoflex. He is not in compression. ?03/15/2021: Continued improvement in the wound. Granulation tissue beginning to fill in. Still with some drainage. Still fairly tender, but without signs of significant ?infection. Still with topical gentamicin and Iodoflex with Kerlix and Coban wraps. ?03/23/2021: There is great granulation tissue filling in and covering the tibia now. Slough is significantly decreased. Periwound is the most tender area for him. ?03/30/2021: The wound continues to heal nicely. Granulation tissue has filled nearly the entire depth of the wound at the caudal aspect. Very little slough. ?Significant improvement in pain. ?04/06/2021: His wound continues to heal. The granulation tissue continues to fill and the tunnel is closed. Almost no slough present and his pain is essentially nil. ?We have been using silver collagen. ?04/13/2021: The wound is healing very nicely. At the inferior portion, the granulation tissue has filled to the point that the opening is flush with the surface. Very ?minimal slough. No pain. We have been using Prisma silver collagen. ?[name] [name]: The wound  continues to contract and fill with granulation tissue. Minimal slough, no pain, no concern for infection. He has an Prisma silver collagen. ?Electronic Signature(s) ?Signed: 04/20/2021 9:06:47 AM By: [name] Guess MD FACS ?Entered By: [name] [name] [name] on 04/20/2021 09:06:47 ?-------------------------------------------------------------------------------- ?Physical Exam Details ?Patient Name: Date of Service: ?[name] [name] [name], [name] [name] [name] [name] [name] 8:30 A M ?Medical Record Number: [id number] ?Patient Account Number: [id number] ?Date of Birth/Sex: Treating RN: ?1970/06/03 (51 y.o. [name] [name] [name] ?Primary Care Provider: PA [name] [name] [name], NO Other Clinician: ?Referring Provider: ?Treating Provider/Extender: [name] [name] [name] ?[name] ?Weeks in Treatment: 6 ?Constitutional ?. . . . No acute distress. ?Respiratory ?Normal work of breathing on room air. ?Notes ?[name] [name]: The wound continues to contract and fill with granulation tissue. Minimal slough. No concern for infection. ?Electronic Signature(s) ?Signed: 04/20/2021 9:07:39 AM By: [name] Guess MD FACS ?Entered By: [name] [name] [name] on 04/20/2021 09:07:39 ?-------------------------------------------------------------------------------- ?Physician Orders Details ?Patient Name: Date of Service: ?HAEDYN, ANCRUM [name] [name] [name] [name] 8:30 A M ?Medical Record Number: [id number] ?Patient Account Number: [id number] ?Date of Birth/Sex: Treating RN: ?1970-06-06 (51 y.o. 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 - Dr. Lady Gary - Room 2 ?Return A ?Bathing/ Shower/ Hygiene ?May shower with protection but do not get wound dressing(s) wet. - Ok  to use Chartered loss adjuster, can purchase at [facility], [facility], or [employer] ?Edema Control - Lymphedema / SCD / Other ?Right Lower Extremity ?Elevate legs to the level of the heart or above for 30 minutes daily and/or

## 2021-04-25 NOTE — Progress Notes (Signed)
[name] [name], [name] [name] ([id number]) ?Visit Report for 04/20/2021 ?Arrival Information Details ?Patient Name: Date of Service: ?[name] [name], [name] [name] 04/20/2021 8:30 A M ?Medical Record Number: [id number] ?Patient Account Number: [id number] ?Date of Birth/Sex: Treating RN: ?[birth year] (51 y.o. Mare Ferrari ?Primary Care Dwight Adamczak: PA Haig Prophet, NO Other Clinician: ?Referring Vitaly Wanat: ?Treating Olajuwon Fosdick/Extender: Fredirick Maudlin ?Monica Martinez ?Weeks in Treatment: 6 ?Visit Information History Since Last Visit ?Added or deleted any medications: No ?Patient Arrived: Ambulatory ?Any new allergies or adverse reactions: No ?Arrival Time: 08:34 ?Had a fall or experienced change in No ?Accompanied By: self ?activities of daily living that may affect ?Transfer Assistance: None ?risk of falls: ?Patient Identification Verified: Yes ?Signs or symptoms of abuse/neglect since last visito No ?Secondary Verification Process Completed: Yes ?Hospitalized since last visit: No ?Patient Requires Transmission-Based Precautions: No ?Implantable device outside of the clinic excluding No ?Patient Has Alerts: No ?cellular tissue based products placed in the center ?since last visit: ?Has Dressing in Place as Prescribed: Yes ?Has Compression in Place as Prescribed: Yes ?Pain Present Now: No ?Electronic Signature(s) ?Signed: 04/25/2021 4:54:39 PM By: Sharyn Creamer RN, BSN ?Entered By: Sharyn Creamer on 04/20/2021 08:36:39 ?-------------------------------------------------------------------------------- ?Encounter Discharge Information Details ?Patient Name: Date of Service: ?[name] [name], [name] [name] 04/20/2021 8:30 A M ?Medical Record Number: [id number] ?Patient Account Number: [id number] ?Date of Birth/Sex: Treating RN: ?1970-12-29 (51 y.o. Mare Ferrari ?Primary Care Jumanah Hynson: PA Haig Prophet, NO Other Clinician: ?Referring Shree Espey: ?Treating Jayra Choyce/Extender: Fredirick Maudlin ?Monica Martinez ?Weeks in Treatment: 6 ?Encounter Discharge Information Items Post  Procedure Vitals ?Discharge Condition: Stable ?Temperature (F): 98.2 ?Ambulatory Status: Ambulatory ?Pulse (bpm): 90 ?Discharge Destination: Home ?Respiratory Rate (breaths/min): 16 ?Transportation: Private Auto ?Blood Pressure (mmHg): 135/82 ?Accompanied By: self ?Schedule Follow-up Appointment: Yes ?Clinical Summary of Care: Patient Declined ?Electronic Signature(s) ?Signed: 04/25/2021 4:54:39 PM By: Sharyn Creamer RN, BSN ?Entered By: Sharyn Creamer on 04/20/2021 09:09:34 ?-------------------------------------------------------------------------------- ?Lower Extremity Assessment Details ?Patient Name: ?Date of Service: ?[name] [name], [name] [name] 04/20/2021 8:30 A M ?Medical Record Number: [id number] ?Patient Account Number: [id number] ?Date of Birth/Sex: ?Treating RN: ?03/02/1970 (51 y.o. Mare Ferrari ?Primary Care Francisca Harbuck: PA [place], NO ?Other Clinician: ?Referring Akesha Uresti: ?Treating Miki Labuda/Extender: Fredirick Maudlin ?Monica Martinez ?Weeks in Treatment: 6 ?Edema Assessment ?Assessed: [Left: No] [Right: No] ?E[Left: dema] [Right: :] ?Calf ?Left: Right: ?Point of Measurement: 33 cm From Medial Instep 31.5 cm ?Ankle ?Left: Right: ?Point of Measurement: 10 cm From Medial Instep 19 cm ?Vascular Assessment ?Pulses: ?Dorsalis Pedis ?Palpable: [Right:Yes] ?Electronic Signature(s) ?Signed: 04/25/2021 4:54:39 PM By: Sharyn Creamer RN, BSN ?Entered By: Sharyn Creamer on 04/20/2021 08:44:07 ?-------------------------------------------------------------------------------- ?Multi Wound Chart Details ?Patient Name: ?Date of Service: ?[name] [name], [name] [name] 04/20/2021 8:30 A M ?Medical Record Number: [id number] ?Patient Account Number: [id number] ?Date of Birth/Sex: ?Treating RN: ?04-01-1970 (51 y.o. Janyth Contes ?Primary Care Damarian Priola: PA [place], NO ?Other Clinician: ?Referring Janaya Broy: ?Treating Natividad Halls/Extender: Fredirick Maudlin ?Monica Martinez ?Weeks in Treatment: 6 ?Vital Signs ?Height(in): 71 ?Pulse(bpm):  90 ?Weight(lbs): 165 ?Blood Pressure(mmHg): 135/82 ?Body Mass Index(BMI): 23 ?Temperature(??F): 98.2 ?Respiratory Rate(breaths/min): 16 ?Photos: [N/A:N/A] ?Right, Medial, Anterior Lower Leg N/A N/A ?Wound Location: ?Trauma N/A N/A ?Wounding Event: ?Trauma, Other N/A N/A ?Primary Etiology: ?01/19/2021 N/A N/A ?Date Acquired: ?6 N/A N/A ?Weeks of Treatment: ?Open N/A N/A ?Wound Status: ?No N/A N/A ?Wound Recurrence: ?7x1.3x0.2 N/A N/A ?Measurements L x W x D (cm) ?7.147 N/A N/A ?A (cm?) : ?rea ?1.429 N/A N/A ?Volume (cm?) : ?49.40% N/A N/A ?% Reduction in A rea: ?85.60% N/A N/A ?% Reduction in Volume: ?Full Thickness  With Exposed Support N/A N/A ?Classification: ?Structures ?Medium N/A N/A ?Exudate A mount: ?Serosanguineous N/A N/A ?Exudate Type: ?red, brown N/A N/A ?Exudate Color: ?Well defined, not attached N/A N/A ?Wound Margin: ?Large (67-100%) N/A N/A ?Granulation A mount: ?Red, Pink N/A N/A ?Granulation Quality: ?Small (1-33%) N/A N/A ?Necrotic A mount: ?Fat Layer (Subcutaneous Tissue): Yes N/A N/A ?Exposed Structures: ?Fascia: No ?Tendon: No ?Muscle: No ?Joint: No ?Bone: No ?Small (1-33%) N/A N/A ?Epithelialization: ?Debridement - Excisional N/A N/A ?Debridement: ?Pre-procedure Verification/Time Out 08:51 N/A N/A ?Taken: ?Other N/A N/A ?Pain Control: ?Subcutaneous, Slough N/A N/A ?Tissue Debrided: ?Skin/Subcutaneous Tissue N/A N/A ?Level: ?6.5 N/A N/A ?Debridement A (sq cm): ?rea ?Curette N/A N/A ?Instrument: ?Minimum N/A N/A ?Bleeding: ?Pressure N/A N/A ?Hemostasis A chieved: ?0 N/A N/A ?Procedural Pain: ?0 N/A N/A ?Post Procedural Pain: ?Procedure was tolerated well N/A N/A ?Debridement Treatment Response: ?7x1.3x0.2 N/A N/A ?Post Debridement Measurements L x ?W x D (cm) ?1.429 N/A N/A ?Post Debridement Volume: (cm?) ?Debridement N/A N/A ?Procedures Performed: ?Treatment Notes ?Electronic Signature(s) ?Signed: 04/20/2021 8:54:46 AM By: Fredirick Maudlin MD FACS ?Signed: 04/23/2021 5:45:33 PM By: Levan Hurst  RN, BSN ?Entered By: Fredirick Maudlin on 04/20/2021 08:54:46 ?-------------------------------------------------------------------------------- ?Multi-Disciplinary Care Plan Details ?Patient Name: ?Date of Service: ?[name] [name], [name] [name] 04/20/2021 8:30 A M ?Medical Record Number: [id number] ?Patient Account Number: [id number] ?Date of Birth/Sex: ?Treating RN: ?[birth year] (51 y.o. Mare Ferrari ?Primary Care Yoshi Mancillas: PA [place], NO ?Other Clinician: ?Referring Jakwan Sally: ?Treating Chancie Lampert/Extender: Fredirick Maudlin ?Monica Martinez ?Weeks in Treatment: 6 ?Active Inactive ?Wound/Skin Impairment ?Nursing Diagnoses: ?Impaired tissue integrity ?Knowledge deficit related to ulceration/compromised skin integrity ?Goals: ?Patient/caregiver will verbalize understanding of skin care regimen ?Date Initiated: 03/05/2021 ?Target Resolution Date: 05/04/2021 ?Goal Status: Active ?Ulcer/skin breakdown will have a volume reduction of 30% by week 4 ?Date Initiated: 03/05/2021 ?Date Inactivated: 03/30/2021 ?Target Resolution Date: 04/06/2021 ?Goal Status: Met ?Interventions: ?Assess patient/caregiver ability to obtain necessary supplies ?Assess patient/caregiver ability to perform ulcer/skin care regimen upon admission and as needed ?Assess ulceration(s) every visit ?Provide education on ulcer and skin care ?Notes: ?Electronic Signature(s) ?Signed: 04/25/2021 4:54:39 PM By: Sharyn Creamer RN, BSN ?Entered By: Sharyn Creamer on 04/20/2021 08:33:41 ?-------------------------------------------------------------------------------- ?Pain Assessment Details ?Patient Name: ?Date of Service: ?[name] [name], [name] [name] 04/20/2021 8:30 A M ?Medical Record Number: [id number] ?Patient Account Number: [id number] ?Date of Birth/Sex: ?Treating RN: ?03-Aug-1970 (51 y.o. Mare Ferrari ?Primary Care Marketia Stallsmith: PA [place], NO ?Other Clinician: ?Referring Joelle Flessner: ?Treating Maja Mccaffery/Extender: Fredirick Maudlin ?Monica Martinez ?Weeks in Treatment: 6 ?Active  Problems ?Location of Pain Severity and Description of Pain ?Patient Has Paino No ?Site Locations ?Pain Management and Medication ?Current Pain Management: ?Electronic Signature(s) ?Signed: 04/25/2021 4:54:39 PM By: Rowe Robert

## 2021-04-27 ENCOUNTER — Encounter (HOSPITAL_BASED_OUTPATIENT_CLINIC_OR_DEPARTMENT_OTHER): Payer: Self-pay | Admitting: General Surgery

## 2021-04-27 NOTE — Progress Notes (Signed)
NUMAIR, SCHALOW ([id number]) ?Visit Report for 04/27/2021 ?Arrival Information Details ?Patient Name: Date of Service: ?SUYOG, PENIX RD 04/27/2021 8:30 A M ?Medical Record Number: [id number] ?Patient Account Number: [id number] ?Date of Birth/Sex: Treating RN: ?[birth year] (51 y.o. Jerilynn Mages) Dellie Catholic ?Primary Care Kailah Pennel: PA Haig Prophet, NO Other Clinician: ?Referring Tere Mcconaughey: ?Treating Kensington Duerst/Extender: Fredirick Maudlin ?Monica Martinez ?Weeks in Treatment: 7 ?Visit Information History Since Last Visit ?Added or deleted any medications: No ?Patient Arrived: Ambulatory ?Any new allergies or adverse reactions: No ?Arrival Time: 08:27 ?Had a fall or experienced change in No ?Accompanied By: self ?activities of daily living that may affect ?Transfer Assistance: None ?risk of falls: ?Patient Requires Transmission-Based Precautions: No ?Signs or symptoms of abuse/neglect since last visito No ?Patient Has Alerts: No ?Hospitalized since last visit: No ?Implantable device outside of the clinic excluding No ?cellular tissue based products placed in the center ?since last visit: ?Has Dressing in Place as Prescribed: Yes ?Pain Present Now: No ?Electronic Signature(s) ?Signed: 04/27/2021 5:17:32 PM By: Dellie Catholic RN ?Entered By: Dellie Catholic on 04/27/2021 08:28:08 ?-------------------------------------------------------------------------------- ?Clinic Level of Care Assessment Details ?Patient Name: Date of Service: ?ODIE, NEHER RD 04/27/2021 8:30 A M ?Medical Record Number: [id number] ?Patient Account Number: [id number] ?Date of Birth/Sex: Treating RN: ?[birth year] (51 y.o. M) Rolin Barry, Bobbi ?Primary Care Jonan Seufert: PA Haig Prophet, NO Other Clinician: ?Referring Quincey Nored: ?Treating Timmie Calix/Extender: Fredirick Maudlin ?Monica Martinez ?Weeks in Treatment: 7 ?Clinic Level of Care Assessment Items ?TOOL 4 Quantity Score ?X- 1 0 ?Use when only an EandM is performed on FOLLOW-UP visit ?ASSESSMENTS - Nursing Assessment / Reassessment ?X-  1 10 ?Reassessment of Co-morbidities (includes updates in patient status) ?X- 1 5 ?Reassessment of Adherence to Treatment Plan ?ASSESSMENTS - Wound and Skin A ssessment / Reassessment ?X - Simple Wound Assessment / Reassessment - one wound 1 5 ?[]  - 0 ?Complex Wound Assessment / Reassessment - multiple wounds ?X- 1 10 ?Dermatologic / Skin Assessment (not related to wound area) ?ASSESSMENTS - Focused Assessment ?X- 1 5 ?Circumferential Edema Measurements - multi extremities ?[]  - 0 ?Nutritional Assessment / Counseling / Intervention ?[]  - 0 ?Lower Extremity Assessment (monofilament, tuning fork, pulses) ?[]  - 0 ?Peripheral Arterial Disease Assessment (using hand held doppler) ?ASSESSMENTS - Ostomy and/or Continence Assessment and Care ?[]  - 0 ?Incontinence Assessment and Management ?[]  - 0 ?Ostomy Care Assessment and Management (repouching, etc.) ?PROCESS - Coordination of Care ?X - Simple Patient / Family Education for ongoing care 1 15 ?[]  - 0 ?Complex (extensive) Patient / Family Education for ongoing care ?X- 1 10 ?Staff obtains Consents, Records, T Results / Process Orders ?est ?[]  - 0 ?Staff telephones HHA, Nursing Homes / Clarify orders / etc ?[]  - 0 ?Routine Transfer to another Facility (non-emergent condition) ?[]  - 0 ?Routine Hospital Admission (non-emergent condition) ?[]  - 0 ?New Admissions / Biomedical engineer / Ordering NPWT Apligraf, etc. ?, ?[]  - 0 ?Emergency Hospital Admission (emergent condition) ?X- 1 10 ?Simple Discharge Coordination ?[]  - 0 ?Complex (extensive) Discharge Coordination ?PROCESS - Special Needs ?[]  - 0 ?Pediatric / Minor Patient Management ?[]  - 0 ?Isolation Patient Management ?[]  - 0 ?Hearing / Language / Visual special needs ?[]  - 0 ?Assessment of Community assistance (transportation, D/C planning, etc.) ?[]  - 0 ?Additional assistance / Altered mentation ?[]  - 0 ?Support Surface(s) Assessment (bed, cushion, seat, etc.) ?INTERVENTIONS - Wound Cleansing / Measurement ?X -  Simple Wound Cleansing - one wound 1 5 ?[]  - 0 ?Complex Wound Cleansing - multiple wounds ?X- 1 5 ?Wound Imaging (photographs -  any number of wounds) ?[]  - 0 ?Wound Tracing (instead of photographs) ?X- 1 5 ?Simple Wound Measurement - one wound ?[]  - 0 ?Complex Wound Measurement - multiple wounds ?INTERVENTIONS - Wound Dressings ?[]  - 0 ?Small Wound Dressing one or multiple wounds ?[]  - 0 ?Medium Wound Dressing one or multiple wounds ?X- 1 20 ?Large Wound Dressing one or multiple wounds ?[]  - 0 ?Application of Medications - topical ?[]  - 0 ?Application of Medications - injection ?INTERVENTIONS - Miscellaneous ?[]  - 0 ?External ear exam ?[]  - 0 ?Specimen Collection (cultures, biopsies, blood, body fluids, etc.) ?[]  - 0 ?Specimen(s) / Culture(s) sent or taken to Lab for analysis ?[]  - 0 ?Patient Transfer (multiple staff / Civil Service fast streamer / Similar devices) ?[]  - 0 ?Simple Staple / Suture removal (25 or less) ?[]  - 0 ?Complex Staple / Suture removal (26 or more) ?[]  - 0 ?Hypo / Hyperglycemic Management (close monitor of Blood Glucose) ?[]  - 0 ?Ankle / Brachial Index (ABI) - do not check if billed separately ?X- 1 5 ?Vital Signs ?Has the patient been seen at the hospital within the last three years: Yes ?Total Score: 110 ?Level Of Care: New/Established - Level 3 ?Electronic Signature(s) ?Signed: 04/27/2021 3:45:50 PM By: Deon Pilling RN, BSN ?Entered By: Deon Pilling on 04/27/2021 08:43:30 ?-------------------------------------------------------------------------------- ?Encounter Discharge Information Details ?Patient Name: Date of Service: ?MCKENNA, MELLISH RD 04/27/2021 8:30 A M ?Medical Record Number: [id number] ?Patient Account Number: [id number] ?Date of Birth/Sex: Treating RN: ?1970/03/22 (51 y.o. M) Rolin Barry, Bobbi ?Primary Care Braedin Millhouse: PA Haig Prophet, NO Other Clinician: ?Referring Onofrio Klemp: ?Treating Davieon Stockham/Extender: Fredirick Maudlin ?Monica Martinez ?Weeks in Treatment: 7 ?Encounter Discharge Information  Items ?Discharge Condition: Stable ?Ambulatory Status: Ambulatory ?Discharge Destination: Home ?Transportation: Private Auto ?Accompanied By: self ?Schedule Follow-up Appointment: Yes ?Clinical Summary of Care: ?Electronic Signature(s) ?Signed: 04/27/2021 3:45:50 PM By: Deon Pilling RN, BSN ?Entered By: Deon Pilling on 04/27/2021 08:44:00 ?-------------------------------------------------------------------------------- ?Lower Extremity Assessment Details ?Patient Name: Date of Service: ?TAMARIUS, EDELSTEIN RD 04/27/2021 8:30 A M ?Medical Record Number: [id number] ?Patient Account Number: [id number] ?Date of Birth/Sex: Treating RN: ?September 14, 1970 (51 y.o. Jerilynn Mages) Dellie Catholic ?Primary Care Darnisha Vernet: PA Haig Prophet, NO Other Clinician: ?Referring Kamar Callender: ?Treating Hillery Bhalla/Extender: Fredirick Maudlin ?Monica Martinez ?Weeks in Treatment: 7 ?Edema Assessment ?Assessed: [Left: No] [Right: No] ?[Left: Edema] [Right: :] ?Calf ?Left: Right: ?Point of Measurement: 33 cm From Medial Instep 31 cm ?Ankle ?Left: Right: ?Point of Measurement: 10 cm From Medial Instep 18.5 cm ?Electronic Signature(s) ?Signed: 04/27/2021 5:17:32 PM By: Dellie Catholic RN ?Entered By: Dellie Catholic on 04/27/2021 [id number] ?-------------------------------------------------------------------------------- ?Multi Wound Chart Details ?Patient Name: ?Date of Service: ?FIELDS, KAMMERZELL RD 04/27/2021 8:30 A M ?Medical Record Number: [id number] ?Patient Account Number: [id number] ?Date of Birth/Sex: ?Treating RN: ?1970-07-24 (51 y.o. Janyth Contes ?Primary Care Alleene Stoy: PA [place], NO ?Other Clinician: ?Referring Sriman Tally: ?Treating Kyrie Bun/Extender: Fredirick Maudlin ?Monica Martinez ?Weeks in Treatment: 7 ?Vital Signs ?Height(in): 71 ?Pulse(bpm): 87 ?Weight(lbs): 165 ?Blood Pressure(mmHg): 134/80 ?Body Mass Index(BMI): 23 ?Temperature(??F): 97.9 ?Respiratory Rate(breaths/min): 16 ?Photos: [N/A:N/A] ?Right, Medial, Anterior Lower Leg N/A N/A ?Wound Location: ?Trauma  N/A N/A ?Wounding Event: ?Trauma, Other N/A N/A ?Primary Etiology: ?01/19/2021 N/A N/A ?Date Acquired: ?7 N/A N/A ?Weeks of Treatment: ?Open N/A N/A ?Wound Status: ?No N/A N/A ?Wound Recurrence: ?7.4x1x0.1 N/A N/A ?Measur

## 2021-04-27 NOTE — Progress Notes (Signed)
PURVIS, SIDLE ([id number]) ?Visit Report for 04/27/2021 ?Chief Complaint Document Details ?Patient Name: Date of Service: ?[name] [name], CASASOLA RD 04/27/2021 8:30 A M ?Medical Record Number: [id number] ?Patient Account Number: [id number] ?Date of Birth/Sex: Treating RN: ?September 11, 1970 (51 y.o. [name] ?Primary Care Provider: PA Zenovia Jordan, NO Other Clinician: ?Referring Provider: ?Treating Provider/Extender: [name] ?[name] ?Weeks in Treatment: 7 ?Information Obtained from: Patient ?Chief Complaint ?ADMISSION: ?03/09/2021: Patient seen for complaints of Non-Healing Wound. ?Electronic Signature(s) ?Signed: 04/27/2021 8:43:14 AM By: [name] MD FACS ?Entered By: [name] on 04/27/2021 08:43:14 ?-------------------------------------------------------------------------------- ?HPI Details ?Patient Name: Date of Service: ?[name] [name], QUINTO RD 04/27/2021 8:30 A M ?Medical Record Number: [id number] ?Patient Account Number: [id number] ?Date of Birth/Sex: Treating RN: ?1970-05-01 (51 y.o. [name] ?Primary Care Provider: PA Zenovia Jordan, NO Other Clinician: ?Referring Provider: ?Treating Provider/Extender: [name] ?[name] ?Weeks in Treatment: 7 ?History of Present Illness ?HPI Description: ADMISSION ?03/05/2021: This is a 51 year old man who was injured at work when a metal bar struck his right lower leg. He presented to the emergency department at [facility]long hospital where he underwent radiographic imaging that did not show a fracture. The laceration was sutured and the patient was discharged from the ?emergency room. He returned to the emergency room to weeks later with separation of his wound. At that time, no additional studies were performed, but he ?was placed on a course of antibiotics (doxycycline and Keflex). He completed those about a week ago. He has continued to have significant pain as well as ?drainage from the site. He has been applying triple antibiotic ointment and  a nonstick gauze to the site without significant improvement. He has not experienced ?any fevers or chills. There is some drainage, but significantly less than prior, per his report. He is accompanied by his son today. ?He is otherwise healthy without any significant medical history. He takes no prescribed home medications. ?CLINICAL DATA: Laceration, hit with metal bar ?EXAM: ?RIGHT TIBIA AND FIBULA - 2 VIEW ?COMPARISON: 06/26/2018 ?FINDINGS: ?Frontal and lateral views of the right tibia and fibula are ?obtained. No acute displaced fracture. Right knee and ankle are well ?aligned. Mild soft tissue swelling involving the anterolateral right ?lower leg. No radiopaque foreign body. ?IMPRESSION: ?1. Mild soft tissue swelling. No fracture or radiopaque foreign ?body. ?No labs were performed at either emergency department evaluation. ?03/09/2021: The wound is slightly smaller today. He still has some tenderness associated with it. Still with some drainage. Culture taken on Monday returned with ?a very small amount of Escherichia coli, sensitive to gentamicin. He is currently getting topical gentamicin ointment with Iodoflex. He is not in compression. ?03/15/2021: Continued improvement in the wound. Granulation tissue beginning to fill in. Still with some drainage. Still fairly tender, but without signs of significant ?infection. Still with topical gentamicin and Iodoflex with Kerlix and Coban wraps. ?03/23/2021: There is great granulation tissue filling in and covering the tibia now. Slough is significantly decreased. Periwound is the most tender area for him. ?03/30/2021: The wound continues to heal nicely. Granulation tissue has filled nearly the entire depth of the wound at the caudal aspect. Very little slough. ?Significant improvement in pain. ?04/06/2021: His wound continues to heal. The granulation tissue continues to fill and the tunnel is closed. Almost no slough present and his pain is essentially nil. ?We have been  using silver collagen. ?04/13/2021: The wound is healing very nicely. At the inferior portion, the granulation tissue has filled to the point that the opening is flush  with the surface. Very ?minimal slough. No pain. We have been using Prisma silver collagen. ?04/20/2021: The wound continues to contract and fill with granulation tissue. Minimal slough, no pain, no concern for infection. He is in Prisma silver collagen. ?04/27/2021: The wound is roughly the same size, but it continues to fill and is much shallower. No slough, no concern for infection. We have been using Prisma ?silver collagen with Kerlix and Coban wrapping. ?Electronic Signature(s) ?Signed: 04/27/2021 8:44:06 AM By: [name] MD FACS ?Entered By: [name] on 04/27/2021 08:44:06 ?-------------------------------------------------------------------------------- ?Physical Exam Details ?Patient Name: Date of Service: ?[name] [name], HEFFNER RD 04/27/2021 8:30 A M ?Medical Record Number: [id number] ?Patient Account Number: [id number] ?Date of Birth/Sex: Treating RN: ?21-May-1970 (52 y.o. [name] ?Primary Care Provider: PA Zenovia Jordan, NO Other Clinician: ?Referring Provider: ?Treating Provider/Extender: [name] ?[name] ?Weeks in Treatment: 7 ?Constitutional ?. . . . No acute distress. ?Respiratory ?Normal work of breathing on room air. ?Notes ?04/27/2021: The wound is about the same size, but it is much more superficial with a good base of granulation tissue. No concern for infection. ?Electronic Signature(s) ?Signed: 04/27/2021 8:45:17 AM By: [name] MD FACS ?Entered By: [name] on 04/27/2021 08:45:16 ?-------------------------------------------------------------------------------- ?Physician Orders Details ?Patient Name: Date of Service: ?PRESLEY, SUMMERLIN RD 04/27/2021 8:30 A M ?Medical Record Number: [id number] ?Patient Account Number: [id number] ?Date of Birth/Sex: Treating RN: ?05/25/1970 (51 y.o. 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 - Dr. Lady Gary - Room 2 0830 05/04/2021 ?Return A ?Bathing/ Shower/ Hygiene ?May shower with protection but do not get wound dressing(s) wet. - Ok to use Chartered loss adjuster, can purchase at [facility], [facility], or [employer] ?Edema Control - Lymphedema / SCD / Other ?Right Lower Extremity ?Elevate legs to the level of the heart or above for 30 minutes daily and/or when sitting, a frequency of: - Throughout the day ?Avoid standing for long periods of time. ?Wound Treatment ?Wound #1 - Lower Leg Wound Laterality: Right, Medial, Anterior ?Cleanser: Soap and Water 1 x Per Week/30 Days ?Discharge Instructions: May shower and wash wound with dial antibacterial soap and water prior to dressing change. ?Cleanser: Wound Cleanser 1 x Per Week/30 Days ?Discharge Instructions: Cleanse the wound with wound cleanser prior to applying a clean dressing using gauze sponges, not tissue or cotton balls. ?Peri-Wound Care: Triamcinolone 15 (g) 1 x Per Week/30 Days ?Discharge Instructions: Use triamcinolone 15 (g) as directed ?Peri-Wound Care: Sween Lotion (Moisturizing lotion) 1 x Per Week/30 Days ?Discharge Instructions: Apply moisturizing lotion as directed ?Prim Dressing: Promogran Prisma Matrix, 4.34 (sq in) (silver collagen) 1 x Per Week/30 Days ?ary ?Discharge Instructions: Moisten collagen with saline or hydrogel ?Secondary Dressing: Zetuvit Plus 4x8 in 1 x Per Week/30 Days ?Discharge Instructions: Apply over primary dressing as directed. ?Compression Wrap: Kerlix Roll 4.5x3.1 (in/yd) 1 x Per Week/30 Days ?Discharge Instructions: Apply Kerlix and Coban  compression as directed. ?Compression Wrap: Coban Self-Adherent Wrap 4x5 (in/yd) 1 x Per Week/30 Days ?Discharge Instructions: Apply over Kerlix as directed. ?Electronic Signature(s) ?Signed: 04/27/2021 8:45:32 AM By: Tamala Bari

## 2021-05-04 ENCOUNTER — Encounter (HOSPITAL_BASED_OUTPATIENT_CLINIC_OR_DEPARTMENT_OTHER): Payer: Self-pay | Admitting: General Surgery

## 2021-05-07 NOTE — Progress Notes (Signed)
[name] [name], [name] [name] ([id number]) ?Visit Report for 05/04/2021 ?Arrival Information Details ?Patient Name: Date of Service: ?[name] [name], [name] [name] 05/04/2021 8:30 A M ?Medical Record Number: [id number] ?Patient Account Number: [id number] ?Date of Birth/Sex: Treating RN: ?Sep 13, 1970 (51 y.o. Janyth Contes ?Primary Care Elliott Lasecki: PA Haig Prophet, NO Other Clinician: ?Referring Mathius Birkeland: ?Treating Charde Macfarlane/Extender: Fredirick Maudlin ?Monica Martinez ?Weeks in Treatment: 8 ?Visit Information History Since Last Visit ?Added or deleted any medications: No ?Patient Arrived: Ambulatory ?Any new allergies or adverse reactions: No ?Arrival Time: 08:33 ?Had a fall or experienced change in No ?Accompanied By: self ?activities of daily living that may affect ?Transfer Assistance: None ?risk of falls: ?Patient Identification Verified: Yes ?Signs or symptoms of abuse/neglect since last visito No ?Secondary Verification Process Completed: Yes ?Hospitalized since last visit: No ?Patient Requires Transmission-Based Precautions: No ?Implantable device outside of the clinic excluding No ?Patient Has Alerts: No ?cellular tissue based products placed in the center ?since last visit: ?Has Dressing in Place as Prescribed: Yes ?Has Compression in Place as Prescribed: Yes ?Pain Present Now: No ?Electronic Signature(s) ?Signed: 05/07/2021 5:16:31 PM By: Adline Peals ?Entered By: Adline Peals on 05/04/2021 08:33:43 ?-------------------------------------------------------------------------------- ?Encounter Discharge Information Details ?Patient Name: Date of Service: ?[name] [name], [name] [name] 05/04/2021 8:30 A M ?Medical Record Number: [id number] ?Patient Account Number: [id number] ?Date of Birth/Sex: Treating RN: ?Sep 05, 1970 (51 y.o. Janyth Contes ?Primary Care Renate Danh: PA Haig Prophet, NO Other Clinician: ?Referring Kameah Rawl: ?Treating Khailee Mick/Extender: Fredirick Maudlin ?Monica Martinez ?Weeks in Treatment: 8 ?Encounter Discharge Information Items  Post Procedure Vitals ?Discharge Condition: Stable ?Temperature (F): 97.7 ?Ambulatory Status: Ambulatory ?Pulse (bpm): 85 ?Discharge Destination: Home ?Respiratory Rate (breaths/min): 16 ?Transportation: Private Auto ?Blood Pressure (mmHg): 154/84 ?Accompanied By: SELF ?Schedule Follow-up Appointment: Yes ?Clinical Summary of Care: Patient Declined ?Electronic Signature(s) ?Signed: 05/07/2021 5:16:31 PM By: Adline Peals ?Entered By: Adline Peals on 05/04/2021 09:10:22 ?-------------------------------------------------------------------------------- ?Lower Extremity Assessment Details ?Patient Name: ?Date of Service: ?[name] [name], [name] [name] 05/04/2021 8:30 A M ?Medical Record Number: [id number] ?Patient Account Number: [id number] ?Date of Birth/Sex: ?Treating RN: ?1970-12-02 (51 y.o. Janyth Contes ?Primary Care Kahiau Schewe: PA [place], [place] ?Other Clinician: ?Referring Deovion Batrez: ?Treating Charls Custer/Extender: Fredirick Maudlin ?Monica Martinez ?Weeks in Treatment: 8 ?Edema Assessment ?Assessed: [Left: No] [Right: No] ?E[Left: dema] [Right: :] ?Calf ?Left: Right: ?Point of Measurement: 33 cm From Medial Instep 32 cm ?Ankle ?Left: Right: ?Point of Measurement: 10 cm From Medial Instep 18.5 cm ?Vascular Assessment ?Pulses: ?Dorsalis Pedis ?Palpable: [Right:Yes] ?Electronic Signature(s) ?Signed: 05/07/2021 5:16:31 PM By: Adline Peals ?Entered By: Adline Peals on 05/04/2021 08:43:54 ?-------------------------------------------------------------------------------- ?Multi Wound Chart Details ?Patient Name: ?Date of Service: ?[name] [name], [name] [name] 05/04/2021 8:30 A M ?Medical Record Number: [id number] ?Patient Account Number: [id number] ?Date of Birth/Sex: ?Treating RN: ?21-Mar-1970 (51 y.o. Janyth Contes ?Primary Care Yittel Emrich: PA [place], [place] ?Other Clinician: ?Referring Lovinia Snare: ?Treating Charlisha Market/Extender: Fredirick Maudlin ?Monica Martinez ?Weeks in Treatment: 8 ?Vital Signs ?Height(in): 71 ?Pulse(bpm):  85 ?Weight(lbs): 165 ?Blood Pressure(mmHg): 154/84 ?Body Mass Index(BMI): 23 ?Temperature(??F): 97.7 ?Respiratory Rate(breaths/min): 16 ?Photos: [N/A:N/A] ?Right, Medial, Anterior Lower Leg N/A N/A ?Wound Location: ?Trauma N/A N/A ?Wounding Event: ?Trauma, Other N/A N/A ?Primary Etiology: ?01/19/2021 N/A N/A ?Date Acquired: ?8 N/A N/A ?Weeks of Treatment: ?Open N/A N/A ?Wound Status: ?No N/A N/A ?Wound Recurrence: ?6.6x1x0.1 N/A N/A ?Measurements L x W x D (cm) ?5.184 N/A N/A ?A (cm?) : ?rea ?0.518 N/A N/A ?Volume (cm?) : ?63.30% N/A N/A ?% Reduction in A rea: ?94.80% N/A N/A ?% Reduction in Volume: ?Full Thickness With Exposed Support N/A N/A ?Classification: ?  Structures ?Medium N/A N/A ?Exudate A mount: ?Serosanguineous N/A N/A ?Exudate Type: ?red, brown N/A N/A ?Exudate Color: ?Well defined, not attached N/A N/A ?Wound Margin: ?Large (67-100%) N/A N/A ?Granulation A mount: ?Red, Pink N/A N/A ?Granulation Quality: ?Small (1-33%) N/A N/A ?Necrotic A mount: ?Fat Layer (Subcutaneous Tissue): Yes N/A N/A ?Exposed Structures: ?Fascia: No ?Tendon: No ?Muscle: No ?Joint: No ?Bone: No ?Small (1-33%) N/A N/A ?Epithelialization: ?Debridement - Selective/Open Wound N/A N/A ?Debridement: ?Pre-procedure Verification/Time Out 08:50 N/A N/A ?Taken: ?Lidocaine 4% Topical Solution N/A N/A ?Pain Control: ?Necrotic/Eschar N/A N/A ?Tissue Debrided: ?Non-Viable Tissue N/A N/A ?Level: ?4 N/A N/A ?Debridement A (sq cm): ?rea ?Curette N/A N/A ?Instrument: ?Minimum N/A N/A ?Bleeding: ?Pressure N/A N/A ?Hemostasis A chieved: ?0 N/A N/A ?Procedural Pain: ?0 N/A N/A ?Post Procedural Pain: ?Procedure was tolerated well N/A N/A ?Debridement Treatment Response: ?6.6x1x0.1 N/A N/A ?Post Debridement Measurements L x ?W x D (cm) ?0.518 N/A N/A ?Post Debridement Volume: (cm?) ?Debridement N/A N/A ?Procedures Performed: ?Treatment Notes ?Electronic Signature(s) ?Signed: 05/04/2021 9:02:20 AM By: Fredirick Maudlin MD FACS ?Signed: 05/07/2021 5:50:11 PM  By: Levan Hurst RN, BSN ?Entered By: Fredirick Maudlin on 05/04/2021 09:02:20 ?-------------------------------------------------------------------------------- ?Multi-Disciplinary Care Plan Details ?Patient Name: ?Date of Service: ?[name] [name], [name] [name] 05/04/2021 8:30 A M ?Medical Record Number: [id number] ?Patient Account Number: [id number] ?Date of Birth/Sex: ?Treating RN: ?May 31, 1970 (51 y.o. Janyth Contes ?Primary Care Jahni Paul: PA [place], [place] ?Other Clinician: ?Referring Akiel Fennell: ?Treating Matix Henshaw/Extender: Fredirick Maudlin ?Monica Martinez ?Weeks in Treatment: 8 ?Active Inactive ?Wound/Skin Impairment ?Nursing Diagnoses: ?Impaired tissue integrity ?Knowledge deficit related to ulceration/compromised skin integrity ?Goals: ?Patient/caregiver will verbalize understanding of skin care regimen ?Date Initiated: 03/05/2021 ?Target Resolution Date: 05/25/2021 ?Goal Status: Active ?Ulcer/skin breakdown will have a volume reduction of 30% by week 4 ?Date Initiated: 03/05/2021 ?Date Inactivated: 03/30/2021 ?Target Resolution Date: 04/06/2021 ?Goal Status: Met ?Interventions: ?Assess patient/caregiver ability to obtain necessary supplies ?Assess patient/caregiver ability to perform ulcer/skin care regimen upon admission and as needed ?Assess ulceration(s) every visit ?Provide education on ulcer and skin care ?Notes: ?Electronic Signature(s) ?Signed: 05/07/2021 5:16:31 PM By: Adline Peals ?Entered By: Adline Peals on 05/04/2021 08:44:14 ?-------------------------------------------------------------------------------- ?Pain Assessment Details ?Patient Name: ?Date of Service: ?[name] [name], [name] [name] 05/04/2021 8:30 A M ?Medical Record Number: [id number] ?Patient Account Number: [id number] ?Date of Birth/Sex: ?Treating RN: ?[birth year] (51 y.o. Janyth Contes ?Primary Care Yatzari Jonsson: PA [place], [place] ?Other Clinician: ?Referring Cantrell Martus: ?Treating Mckenzie Toruno/Extender: Fredirick Maudlin ?Monica Martinez ?Weeks in  Treatment: 8 ?Active Problems ?Location of Pain Severity and Description of Pain ?Patient Has Paino No ?Site Locations ?Rate the pain. ?Current Pain Level: 0 ?Pain Management and Medication ?Current Pain Management

## 2021-05-07 NOTE — Progress Notes (Signed)
[name] [name], [name] [name] ([id number]) ?Visit Report for 05/04/2021 ?Chief Complaint Document Details ?Patient Name: Date of Service: ?REFORD, OLLIFF RD 05/04/2021 8:30 A M ?Medical Record Number: [id number] ?Patient Account Number: [id number] ?Date of Birth/Sex: Treating RN: ?[birth year] (51 y.o. [name] [name] ?Primary Care Provider: PA Zenovia Jordan, NO Other Clinician: ?Referring Provider: ?Treating Provider/Extender: [name] ?[name] ?Weeks in Treatment: 8 ?Information Obtained from: Patient ?Chief Complaint ?ADMISSION: ?03/09/2021: Patient seen for complaints of Non-Healing Wound. ?Electronic Signature(s) ?Signed: 05/04/2021 9:02:26 AM By: [name] MD FACS ?Entered By: [name] on 05/04/2021 09:02:26 ?-------------------------------------------------------------------------------- ?Debridement Details ?Patient Name: Date of Service: ?JAHBARI, REPINSKI RD 05/04/2021 8:30 A M ?Medical Record Number: [id number] ?Patient Account Number: [id number] ?Date of Birth/Sex: Treating RN: ?1970/09/16 (51 y.o. [name] ?Primary Care Provider: PA Zenovia Jordan, NO Other Clinician: ?Referring Provider: ?Treating Provider/Extender: [name] ?[name] ?Weeks in Treatment: 8 ?Debridement Performed for Assessment: Wound #1 Right,Medial,Anterior Lower Leg ?Performed By: Physician [name], MD ?Debridement Type: Debridement ?Level of Consciousness (Pre-procedure): Awake and Alert ?Pre-procedure Verification/Time Out Yes - 08:50 ?Taken: ?Start Time: 08:50 ?Pain Control: Lidocaine 4% Topical Solution ?T Area Debrided (L x W): ?otal 2 (cm) x 2 (cm) = 4 (cm?) ?Tissue and other material debrided: Non-Viable, Eschar ?Level: Non-Viable Tissue ?Debridement Description: Selective/Open Wound ?Instrument: Curette ?Bleeding: Minimum ?Hemostasis Achieved: Pressure ?Procedural Pain: 0 ?Post Procedural Pain: 0 ?Response to Treatment: Procedure was tolerated well ?Level of Consciousness (Post- Awake and  Alert ?procedure): ?Post Debridement Measurements of Total Wound ?Length: (cm) 6.6 ?Width: (cm) 1 ?Depth: (cm) 0.1 ?Volume: (cm?) 0.518 ?Character of Wound/Ulcer Post Debridement: Improved ?Post Procedure Diagnosis ?Same as Pre-procedure ?Electronic Signature(s) ?Signed: 05/04/2021 12:24:03 PM By: [name] MD FACS ?Signed: 05/07/2021 5:16:31 PM By: Samuella Bruin ?Entered By: Samuella Bruin on 05/04/2021 08:53:47 ?-------------------------------------------------------------------------------- ?HPI Details ?Patient Name: Date of Service: ?XAYVION, SHIRAH RD 05/04/2021 8:30 A M ?Medical Record Number: [id number] ?Patient Account Number: [id number] ?Date of Birth/Sex: Treating RN: ?[birth year] (51 y.o. [name] [name] ?Primary Care Provider: PA Zenovia Jordan, NO Other Clinician: ?Referring Provider: ?Treating Provider/Extender: [name] ?[name] ?Weeks in Treatment: 8 ?History of Present Illness ?HPI Description: ADMISSION ?03/05/2021: This is a 51 year old man who was injured at work when a metal bar struck his right lower leg. He presented to the emergency department at [facility]long hospital where he underwent radiographic imaging that did not show a fracture. The laceration was sutured and the patient was discharged from the ?emergency room. He returned to the emergency room to weeks later with separation of his wound. At that time, no additional studies were performed, but he ?was placed on a course of antibiotics (doxycycline and Keflex). He completed those about a week ago. He has continued to have significant pain as well as ?drainage from the site. He has been applying triple antibiotic ointment and a nonstick gauze to the site without significant improvement. He has not experienced ?any fevers or chills. There is some drainage, but significantly less than prior, per his report. He is accompanied by his son today. ?He is otherwise healthy without any significant medical history. He takes no  prescribed home medications. ?CLINICAL DATA: Laceration, hit with metal bar ?EXAM: ?RIGHT TIBIA AND FIBULA - 2 VIEW ?COMPARISON: 06/26/2018 ?FINDINGS: ?Frontal and lateral views of the right tibia and fibula are ?obtained. No acute displaced fracture. Right knee and ankle are well ?aligned. Mild soft tissue swelling involving the anterolateral right ?lower leg. No radiopaque foreign body. ?IMPRESSION: ?1. Mild soft tissue swelling. No fracture  or radiopaque foreign ?body. ?No labs were performed at either emergency department evaluation. ?03/09/2021: The wound is slightly smaller today. He still has some tenderness associated with it. Still with some drainage. Culture taken on Monday returned with ?a very small amount of Escherichia coli, sensitive to gentamicin. He is currently getting topical gentamicin ointment with Iodoflex. He is not in compression. ?03/15/2021: Continued improvement in the wound. Granulation tissue beginning to fill in. Still with some drainage. Still fairly tender, but without signs of significant ?infection. Still with topical gentamicin and Iodoflex with Kerlix and Coban wraps. ?03/23/2021: There is great granulation tissue filling in and covering the tibia now. Slough is significantly decreased. Periwound is the most tender area for him. ?03/30/2021: The wound continues to heal nicely. Granulation tissue has filled nearly the entire depth of the wound at the caudal aspect. Very little slough. ?Significant improvement in pain. ?04/06/2021: His wound continues to heal. The granulation tissue continues to fill and the tunnel is closed. Almost no slough present and his pain is essentially nil. ?We have been using silver collagen. ?04/13/2021: The wound is healing very nicely. At the inferior portion, the granulation tissue has filled to the point that the opening is flush with the surface. Very ?minimal slough. No pain. We have been using Prisma silver collagen. ?04/20/2021: The wound continues to  contract and fill with granulation tissue. Minimal slough, no pain, no concern for infection. He is in Prisma silver collagen. ?04/27/2021: The wound is roughly the same size, but it continues to fill and is much shallower. No slough, no concern for infection. We have been using Prisma ?silver collagen with Kerlix and Coban wrapping. ?05/04/2021: The wound is smaller and has been separated into 2 open sites by epithelium coming across the lower midportion. No slough; small amount of ?eschar. No concern for infection. ?Electronic Signature(s) ?Signed: 05/04/2021 9:03:04 AM By: [name] MD FACS ?Entered By: [name] on 05/04/2021 09:03:03 ?-------------------------------------------------------------------------------- ?Physical Exam Details ?Patient Name: Date of Service: ?KAYDIN, KARBOWSKI RD 05/04/2021 8:30 A M ?Medical Record Number: [id number] ?Patient Account Number: [id number] ?Date of Birth/Sex: Treating RN: ?[birth year] (51 y.o. [name] [name] ?Primary Care Provider: PA Zenovia Jordan, NO Other Clinician: ?Referring Provider: ?Treating Provider/Extender: [name] ?[name] ?Weeks in Treatment: 8 ?Constitutional ?Slightly hypertensive. . . . No acute distress. ?Respiratory ?Normal work of breathing on room air. ?Notes ?05/04/2021: The wound is smaller and has nice epithelialization occurring around the perimeter. It is essentially 2 separate wounds now due to a bridge of ?epithelialized tissue coming across the lower midportion. Good granulation tissue on the surface. Small amount of eschar present. ?Electronic Signature(s) ?Signed: 05/04/2021 9:07:54 AM By: [name] MD FACS ?Entered By: [name] on 05/04/2021 09:07:54 ?-------------------------------------------------------------------------------- ?Physician Orders Details ?Patient Name: Date of Service: ?DARIC, KOREN RD 05/04/2021 8:30 A M ?Medical Record Number: [id number] ?Patient Account Number: [id number] ?Date of Birth/Sex:  Treating RN: ?December 28, 1970 (51 y.o. [name] ?Primary Care Provider: PA Zenovia Jordan, NO Other Clinician: ?Referring Provider: ?Treating Provider/Extender: [name] ?[name] ?Weeks in Tre

## 2021-05-11 ENCOUNTER — Encounter (HOSPITAL_BASED_OUTPATIENT_CLINIC_OR_DEPARTMENT_OTHER): Payer: Self-pay | Attending: General Surgery | Admitting: General Surgery

## 2021-05-11 DIAGNOSIS — L97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: Secondary | ICD-10-CM | POA: Insufficient documentation

## 2021-05-11 DIAGNOSIS — F17210 Nicotine dependence, cigarettes, uncomplicated: Secondary | ICD-10-CM | POA: Insufficient documentation

## 2021-05-11 DIAGNOSIS — Z8619 Personal history of other infectious and parasitic diseases: Secondary | ICD-10-CM | POA: Insufficient documentation

## 2021-05-11 NOTE — Progress Notes (Addendum)
JEFFREE, CAZEAU ([id number]) ?Visit Report for 05/11/2021 ?Chief Complaint Document Details ?Patient Name: Date of Service: ?[name], [name] RD 05/11/2021 8:30 A M ?Medical Record Number: [id number] ?Patient Account Number: [id number] ?Date of Birth/Sex: Treating RN: ?12-27-1970 (51 y.o. [name] [name] ?Primary Care Provider: PA Zenovia Jordan, NO Other Clinician: ?Referring Provider: ?Treating Provider/Extender: [name] ?[name] ?Weeks in Treatment: 9 ?Information Obtained from: Patient ?Chief Complaint ?ADMISSION: ?03/09/2021: Patient seen for complaints of Non-Healing Wound. ?Electronic Signature(s) ?Signed: 05/11/2021 9:27:47 AM By: [name] MD FACS ?Entered By: [name] on 05/11/2021 09:27:47 ?-------------------------------------------------------------------------------- ?HPI Details ?Patient Name: Date of Service: ?[name], [name] RD 05/11/2021 8:30 A M ?Medical Record Number: [id number] ?Patient Account Number: [id number] ?Date of Birth/Sex: Treating RN: ?1970/01/20 (51 y.o. [name] [name] ?Primary Care Provider: PA Zenovia Jordan, NO Other Clinician: ?Referring Provider: ?Treating Provider/Extender: [name] ?[name] ?Weeks in Treatment: 9 ?History of Present Illness ?HPI Description: ADMISSION ?03/05/2021: This is a 51 year old man who was injured at work when a metal bar struck his right lower leg. He presented to the emergency department at [facility] hospital where he underwent radiographic imaging that did not show a fracture. The laceration was sutured and the patient was discharged from the ?emergency room. He returned to the emergency room to weeks later with separation of his wound. At that time, no additional studies were performed, but he ?was placed on a course of antibiotics (doxycycline and Keflex). He completed those about a week ago. He has continued to have significant pain as well as ?drainage from the site. He has been applying triple antibiotic ointment and a  nonstick gauze to the site without significant improvement. He has not experienced ?any fevers or chills. There is some drainage, but significantly less than prior, per his report. He is accompanied by his son today. ?He is otherwise healthy without any significant medical history. He takes no prescribed home medications. ?CLINICAL DATA: Laceration, hit with metal bar ?EXAM: ?RIGHT TIBIA AND FIBULA - 2 VIEW ?COMPARISON: 06/26/2018 ?FINDINGS: ?Frontal and lateral views of the right tibia and fibula are ?obtained. No acute displaced fracture. Right knee and ankle are well ?aligned. Mild soft tissue swelling involving the anterolateral right ?lower leg. No radiopaque foreign body. ?IMPRESSION: ?1. Mild soft tissue swelling. No fracture or radiopaque foreign ?body. ?No labs were performed at either emergency department evaluation. ?03/09/2021: The wound is slightly smaller today. He still has some tenderness associated with it. Still with some drainage. Culture taken on Monday returned with ?a very small amount of Escherichia coli, sensitive to gentamicin. He is currently getting topical gentamicin ointment with Iodoflex. He is not in compression. ?03/15/2021: Continued improvement in the wound. Granulation tissue beginning to fill in. Still with some drainage. Still fairly tender, but without signs of significant ?infection. Still with topical gentamicin and Iodoflex with Kerlix and Coban wraps. ?03/23/2021: There is great granulation tissue filling in and covering the tibia now. Slough is significantly decreased. Periwound is the most tender area for him. ?03/30/2021: The wound continues to heal nicely. Granulation tissue has filled nearly the entire depth of the wound at the caudal aspect. Very little slough. ?Significant improvement in pain. ?04/06/2021: His wound continues to heal. The granulation tissue continues to fill and the tunnel is closed. Almost no slough present and his pain is essentially nil. ?We have been  using silver collagen. ?04/13/2021: The wound is healing very nicely. At the inferior portion, the granulation tissue has filled to the point that the opening is flush  with the surface. Very ?minimal slough. No pain. We have been using Prisma silver collagen. ?04/20/2021: The wound continues to contract and fill with granulation tissue. Minimal slough, no pain, no concern for infection. He is in Prisma silver collagen. ?04/27/2021: The wound is roughly the same size, but it continues to fill and is much shallower. No slough, no concern for infection. We have been using Prisma ?silver collagen with Kerlix and Coban wrapping. ?05/04/2021: The wound is smaller and has been separated into 2 open sites by epithelium coming across the lower midportion. No slough; small amount of ?eschar. No concern for infection. ?05/11/2021: The wound is nearly closed. There is just a small open portion at the proximal aspect. This is shallow and has a nice bed of granulation tissue. ?Electronic Signature(s) ?Signed: 05/11/2021 9:28:57 AM By: [name] MD FACS ?Entered By: [name] on 05/11/2021 09:28:57 ?-------------------------------------------------------------------------------- ?Physical Exam Details ?Patient Name: Date of Service: ?[name], [name] RD 05/11/2021 8:30 A M ?Medical Record Number: [id number] ?Patient Account Number: [id number] ?Date of Birth/Sex: Treating RN: ?05-Jan-1971 (51 y.o. [name] [name] ?Primary Care Provider: PA Zenovia Jordan, NO Other Clinician: ?Referring Provider: ?Treating Provider/Extender: [name] ?[name] ?Weeks in Treatment: 9 ?Constitutional ?. . . . No acute distress. ?Respiratory ?Normal work of breathing on room air. ?Notes ?05/11/2021: The wound is nearly closed. There is just a small open portion at the proximal aspect. This is shallow and has a nice bed of granulation tissue. ?Electronic Signature(s) ?Signed: 05/11/2021 9:29:38 AM By: [name] MD FACS ?Entered By: [name] on 05/11/2021 09:29:38 ?-------------------------------------------------------------------------------- ?Physician Orders Details ?Patient Name: Date of Service: ?[name], [name] RD 05/11/2021 8:30 A M ?Medical Record Number: [id number] ?Patient Account Number: [id number] ?Date of Birth/Sex: Treating RN: ?Oct 23, 1970 (51 y.o. 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 - Dr. Lady Gary - Room 2 ?Return A ?Bathing/ Shower/ Hygiene ?May shower with protection but do not get wound dressing(s) wet. - Ok to use Chartered loss adjuster, can purchase at [facility], [facility], or [employer] ?Edema Control - Lymphedema / SCD / Other ?Right Lower Extremity ?Elevate legs to the level of the heart or above for 30 minutes daily and/or when sitting, a frequency of: - Throughout the day ?Avoid standing for long periods of time. ?Wound Treatment ?Wound #1 - Lower Leg Wound Laterality: Right, Medial, Anterior ?Cleanser: Soap and Water 1 x Per Week/30 Days ?Discharge Instructions: May shower and wash wound with dial antibacterial soap and water prior to dressing change. ?Cleanser: Wound Cleanser 1 x Per Week/30 Days ?Discharge Instructions: Cleanse the wound with wound cleanser prior to applying a clean dressing using gauze sponges, not tissue or cotton balls. ?Peri-Wound Care: Triamcinolone 15 (g) 1 x Per Week/30 Days ?Discharge Instructions: Use triamcinolone 15 (g) as directed ?Peri-Wound Care: Sween Lotion (Moisturizing lotion) 1 x Per Week/30 Days ?Discharge Instructions: Apply moisturizing lotion as directed ?Prim Dressing: Promogran Prisma Matrix, 4.34 (sq in) (silver collagen) 1 x Per Week/30  Days ?ary ?Discharge Instructions: Moisten collagen with saline or hydrogel ?Secondary Dressing: Woven Gauze Sponge, Non-Sterile 4x4 in 1 x Per Week/30 Days ?Discharge Instructions: Apply over primary dres

## 2021-05-11 NOTE — Progress Notes (Signed)
[name] [name], [name] [name] ([id number]) ?Visit Report for 05/11/2021 ?Arrival Information Details ?Patient Name: Date of Service: ?[name] [name], [name] [name] 05/11/2021 8:30 A M ?Medical Record Number: [id number] ?Patient Account Number: [id number] ?Date of Birth/Sex: Treating RN: ?08/14/1970 (51 y.o. [name] [name] ?Primary Care Laree Garron: PA Zenovia Jordan, NO Other Clinician: ?Referring Charlott Calvario: ?Treating Petr Bontempo/Extender: [name] ?[name] ?Weeks in Treatment: 9 ?Visit Information History Since Last Visit ?Added or deleted any medications: No ?Patient Arrived: Ambulatory ?Any new allergies or adverse reactions: No ?Arrival Time: 09:11 ?Had a fall or experienced change in No ?Accompanied By: self ?activities of daily living that may affect ?Transfer Assistance: None ?risk of falls: ?Patient Identification Verified: Yes ?Signs or symptoms of abuse/neglect since last visito No ?Secondary Verification Process Completed: Yes ?Hospitalized since last visit: No ?Patient Requires Transmission-Based Precautions: No ?Implantable device outside of the clinic excluding No ?Patient Has Alerts: No ?cellular tissue based products placed in the center ?since last visit: ?Has Dressing in Place as Prescribed: Yes ?Has Compression in Place as Prescribed: Yes ?Pain Present Now: No ?Electronic Signature(s) ?Signed: 05/11/2021 2:04:38 PM By: Samuella Bruin ?Entered By: Samuella Bruin on 05/11/2021 09:12:20 ?-------------------------------------------------------------------------------- ?Clinic Level of Care Assessment Details ?Patient Name: Date of Service: ?[name] [name], [name] [name] 05/11/2021 8:30 A M ?Medical Record Number: [id number] ?Patient Account Number: [id number] ?Date of Birth/Sex: Treating RN: ?[birth year] (51 y.o. [name] [name] ?Primary Care Jaziyah Gradel: PA Zenovia Jordan, NO Other Clinician: ?Referring Talasia Saulter: ?Treating Kahil Agner/Extender: [name] ?[name] ?Weeks in Treatment: 9 ?Clinic Level of Care Assessment  Items ?TOOL 4 Quantity Score ?X- 1 0 ?Use when only an EandM is performed on FOLLOW-UP visit ?ASSESSMENTS - Nursing Assessment / Reassessment ?X- 1 10 ?Reassessment of Co-morbidities (includes updates in patient status) ?X- 1 5 ?Reassessment of Adherence to Treatment Plan ?ASSESSMENTS - Wound and Skin A ssessment / Reassessment ?X - Simple Wound Assessment / Reassessment - one wound 1 5 ?[]  - 0 ?Complex Wound Assessment / Reassessment - multiple wounds ?[]  - 0 ?Dermatologic / Skin Assessment (not related to wound area) ?ASSESSMENTS - Focused Assessment ?X- 1 5 ?Circumferential Edema Measurements - multi extremities ?[]  - 0 ?Nutritional Assessment / Counseling / Intervention ?X- 1 5 ?Lower Extremity Assessment (monofilament, tuning fork, pulses) ?[]  - 0 ?Peripheral Arterial Disease Assessment (using hand held doppler) ?ASSESSMENTS - Ostomy and/or Continence Assessment and Care ?[]  - 0 ?Incontinence Assessment and Management ?[]  - 0 ?Ostomy Care Assessment and Management (repouching, etc.) ?PROCESS - Coordination of Care ?X - Simple Patient / Family Education for ongoing care 1 15 ?[]  - 0 ?Complex (extensive) Patient / Family Education for ongoing care ?X- 1 10 ?Staff obtains Consents, Records, T Results / Process Orders ?est ?[]  - 0 ?Staff telephones HHA, Nursing Homes / Clarify orders / etc ?[]  - 0 ?Routine Transfer to another Facility (non-emergent condition) ?[]  - 0 ?Routine Hospital Admission (non-emergent condition) ?[]  - 0 ?New Admissions / / Ordering NPWT Apligraf, etc. ?, ?[]  - 0 ?Emergency Hospital Admission (emergent condition) ?X- 1 10 ?Simple Discharge Coordination ?[]  - 0 ?Complex (extensive) Discharge Coordination ?PROCESS - Special Needs ?[]  - 0 ?Pediatric / Minor Patient Management ?[]  - 0 ?Isolation Patient Management ?[]  - 0 ?Hearing / Language / Visual special needs ?[]  - 0 ?Assessment of Community assistance (transportation, D/C planning, etc.) ?[]  - 0 ?Additional  assistance / Altered mentation ?[]  - 0 ?Support Surface(s) Assessment (bed, cushion, seat, etc.) ?INTERVENTIONS - Wound Cleansing / Measurement ?X - Simple Wound Cleansing - one wound 1 5 ?[]  -  0 ?Complex Wound Cleansing - multiple wounds ?[]  - 0 ?Wound Imaging (photographs - any number of wounds) ?[]  - 0 ?Wound Tracing (instead of photographs) ?X- 1 5 ?Simple Wound Measurement - one wound ?[]  - 0 ?Complex Wound Measurement - multiple wounds ?INTERVENTIONS - Wound Dressings ?[]  - 0 ?Small Wound Dressing one or multiple wounds ?X- 1 15 ?Medium Wound Dressing one or multiple wounds ?[]  - 0 ?Large Wound Dressing one or multiple wounds ?[]  - 0 ?Application of Medications - topical ?[]  - 0 ?Application of Medications - injection ?INTERVENTIONS - Miscellaneous ?[]  - 0 ?External ear exam ?[]  - 0 ?Specimen Collection (cultures, biopsies, blood, body fluids, etc.) ?[]  - 0 ?Specimen(s) / Culture(s) sent or taken to Lab for analysis ?[]  - 0 ?Patient Transfer (multiple staff / / Similar devices) ?[]  - 0 ?Simple Staple / Suture removal (25 or less) ?[]  - 0 ?Complex Staple / Suture removal (26 or more) ?[]  - 0 ?Hypo / Hyperglycemic Management (close monitor of Blood Glucose) ?[]  - 0 ?Ankle / Brachial Index (ABI) - do not check if billed separately ?X- 1 5 ?Vital Signs ?Has the patient been seen at the hospital within the last three years: Yes ?Total Score: 95 ?Level Of Care: New/Established - Level 3 ?Electronic Signature(s) ?Signed: 05/11/2021 2:04:38 PM By: ?Entered By: on 05/11/2021 10:56:26 ?-------------------------------------------------------------------------------- ?Encounter Discharge Information Details ?Patient Name: Date of Service: ?[name] [name], [name] [name] 05/11/2021 8:30 A M ?Medical Record Number: ?Patient Account Number: ?Date of Birth/Sex: Treating RN: ?1970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 ?Weeks in Treatment: 9 ?Encounter Discharge Information Items ?Discharge Condition: Stable ?Ambulatory Status: Ambulatory ?Discharge Destination: Home ?Transportation: Private Auto ?Accompanied By: self ?Schedule Follow-up Appointment: Yes ?Clinical Summary of Care: Patient Declined ?Electronic Signature(s) ?Signed: 05/11/2021 2:04:38 PM By: 07/11/2021 ?Entered By: Samuella Bruin on 05/11/2021 09:37:40 ?-------------------------------------------------------------------------------- ?Lower Extremity Assessment Details ?Patient Name: Date of Service: ?[name] [name], [name] [name] 05/11/2021 8:30 A M ?Medical Record Number: 07/11/2021 ?Patient Account Number: [id number] ?Date of Birth/Sex: Treating RN: ?20-Jan-1970 (51 y.o. 44 ?Primary Care Emanuele Mcwhirter: PA [name] [name], NO Other Clinician: ?Referring Rosamund Nyland: ?Treating Gal Feldhaus/Extender: Zenovia Jordan ?[name] ?Weeks in Treatment: 9 ?Edema Assessment ?Assessed: [Left: No] [Right: No] ?[Left: Edema] [Right: :] ?Calf ?Left: Right: ?Point of Measurement: 33 cm From Medial Instep 29.1 cm ?Ankle ?Left: Right: ?Point of Measurement: 10 cm From Medial Instep 18.8 cm ?Vascular Assessment ?Pulses: ?Dorsalis Pedis ?Palpable: [Right:Yes] ?Electronic Signature(s) ?Signed: 05/11/2021 2:04:38 PM By: 07/11/2021 ?Entered By: Samuella Bruin on 05/11/2021 09:19:13 ?-------------------------------------------------------------------------------- ?Multi Wound Chart Details ?Patient Name: ?Date of Service: ?[name] [name], [name] [name] 05/11/2021 8:30 A M ?Medical Record Number: 07/11/2021 ?Patient Account Number: [id number] ?Date of Birth/Sex: ?Treating RN: ?[birth year] (51 y.o. 44 ?Primary Care Giulio Bertino: PA [place], NO ?Other Clinician: ?Referring Jayin Derousse: ?Treating Percell Lamboy/Extender: [name] [name] ?Zenovia Jordan ?Weeks in Treatment: 9 ?Vital Signs ?Height(in):  71 ?Pulse(bpm): 65 ?Weight(lbs): 165 ?Blood Pressure(mmHg): 134/73 ?Body Mass Index(BMI): 23 ?Temperature(??F): 98.1 ?Respiratory Rate(breaths/min): 16 ?Photos: [1:No Photos Right, Medial, Anterior Lower Leg] [N/A:N/A N/A] ?Wou

## 2021-05-18 ENCOUNTER — Encounter (HOSPITAL_BASED_OUTPATIENT_CLINIC_OR_DEPARTMENT_OTHER): Payer: Self-pay | Admitting: General Surgery

## 2021-05-21 NOTE — Progress Notes (Signed)
[name] [name], [name] [name] ([id number]) ?Visit Report for 05/18/2021 ?Arrival Information Details ?Patient Name: Date of Service: ?[name] [name], [name] [name] RD 05/18/2021 7:45 A M ?Medical Record Number: [id number] ?Patient Account Number: [id number] ?Date of Birth/Sex: Treating RN: ?Jul 27, 1970 (51 y.o. Janyth Contes ?Primary Care Adriaan Maltese: PA Haig Prophet, NO Other Clinician: ?Referring [place] Nonaka: ?Treating Buffey Zabinski/Extender: Fredirick Maudlin ?Monica Martinez ?Weeks in Treatment: 10 ?Visit Information History Since Last Visit ?Added or deleted any medications: No ?Patient Arrived: Ambulatory ?Any new allergies or adverse reactions: No ?Arrival Time: 08:05 ?Had a fall or experienced change in No ?Accompanied By: alone ?activities of daily living that may affect ?Transfer Assistance: None ?risk of falls: ?Patient Identification Verified: Yes ?Signs or symptoms of abuse/neglect since last visito No ?Secondary Verification Process Completed: Yes ?Hospitalized since last visit: No ?Patient Requires Transmission-Based Precautions: No ?Implantable device outside of the clinic excluding No ?Patient Has Alerts: No ?cellular tissue based products placed in the center ?since last visit: ?Has Dressing in Place as Prescribed: Yes ?Has Compression in Place as Prescribed: Yes ?Pain Present Now: No ?Electronic Signature(s) ?Signed: 05/18/2021 5:54:45 PM By: Levan Hurst RN, BSN ?Entered By: Levan Hurst on 05/18/2021 08:05:39 ?-------------------------------------------------------------------------------- ?Encounter Discharge Information Details ?Patient Name: Date of Service: ?[name] [name], [name] [name] RD 05/18/2021 7:45 A M ?Medical Record Number: [id number] ?Patient Account Number: [id number] ?Date of Birth/Sex: Treating RN: ?17-Jan-1970 (51 y.o. Janyth Contes ?Primary Care Spencer Cardinal: PA Haig Prophet, NO Other Clinician: ?Referring Kery Batzel: ?Treating Tyrrell Stephens/Extender: Fredirick Maudlin ?Monica Martinez ?Weeks in Treatment: 10 ?Encounter Discharge Information Items  Post Procedure Vitals ?Discharge Condition: Stable ?Temperature (F): 98.4 ?Ambulatory Status: Ambulatory ?Pulse (bpm): 75 ?Discharge Destination: Home ?Respiratory Rate (breaths/min): 18 ?Transportation: Private Auto ?Blood Pressure (mmHg): 154/88 ?Accompanied By: alone ?Schedule Follow-up Appointment: Yes ?Clinical Summary of Care: Patient Declined ?Electronic Signature(s) ?Signed: 05/18/2021 5:54:45 PM By: Levan Hurst RN, BSN ?Entered By: Levan Hurst on 05/18/2021 08:32:13 ?-------------------------------------------------------------------------------- ?Lower Extremity Assessment Details ?Patient Name: ?Date of Service: ?[name] [name], [name] [name] RD 05/18/2021 7:45 A M ?Medical Record Number: [id number] ?Patient Account Number: [id number] ?Date of Birth/Sex: ?Treating RN: ?1970-01-20 (51 y.o. Janyth Contes ?Primary Care Lesslie Mckeehan: PA [place], [place] ?Other Clinician: ?Referring Siegfried Vieth: ?Treating Torin Whisner/Extender: Fredirick Maudlin ?Monica Martinez ?Weeks in Treatment: 10 ?Edema Assessment ?Assessed: [Left: No] [Right: No] ?E[Left: dema] [Right: :] ?Calf ?Left: Right: ?Point of Measurement: 33 cm From Medial Instep 29 cm ?Ankle ?Left: Right: ?Point of Measurement: 10 cm From Medial Instep 18.5 cm ?Vascular Assessment ?Pulses: ?Dorsalis Pedis ?Palpable: [Right:Yes] ?Electronic Signature(s) ?Signed: 05/18/2021 5:54:45 PM By: Levan Hurst RN, BSN ?Entered By: Levan Hurst on 05/18/2021 08:12:51 ?-------------------------------------------------------------------------------- ?Multi Wound Chart Details ?Patient Name: ?Date of Service: ?[name] [name], [name] [name] RD 05/18/2021 7:45 A M ?Medical Record Number: [id number] ?Patient Account Number: [id number] ?Date of Birth/Sex: ?Treating RN: ?04-25-1970 (51 y.o. Janyth Contes ?Primary Care Shaylon Gillean: PA [place], [place] ?Other Clinician: ?Referring Masha Orbach: ?Treating Tyreck Bell/Extender: Fredirick Maudlin ?Monica Martinez ?Weeks in Treatment: 10 ?Vital Signs ?Height(in): 71 ?Pulse(bpm):  75 ?Weight(lbs): 165 ?Blood Pressure(mmHg): 154/88 ?Body Mass Index(BMI): 23 ?Temperature(??F): 98.4 ?Respiratory Rate(breaths/min): 18 ?Photos: [N/A:N/A] ?Right, Medial, Anterior Lower Leg N/A N/A ?Wound Location: ?Trauma N/A N/A ?Wounding Event: ?Trauma, Other N/A N/A ?Primary Etiology: ?01/19/2021 N/A N/A ?Date Acquired: ?14 N/A N/A ?Weeks of Treatment: ?Open N/A N/A ?Wound Status: ?No N/A N/A ?Wound Recurrence: ?0.1x0.1x0.1 N/A N/A ?Measurements L x W x D (cm) ?0.008 N/A N/A ?A (cm?) : ?rea ?0.001 N/A N/A ?Volume (cm?) : ?99.90% N/A N/A ?% Reduction in A rea: ?100.00% N/A N/A ?% Reduction in Volume: ?Full Thickness  With Exposed Support N/A N/A ?Classification: ?Structures ?Small N/A N/A ?Exudate A mount: ?Serosanguineous N/A N/A ?Exudate Type: ?red, brown N/A N/A ?Exudate Color: ?Well defined, not attached N/A N/A ?Wound Margin: ?None Present (0%) N/A N/A ?Granulation A mount: ?Large (67-100%) N/A N/A ?Necrotic A mount: ?Fat Layer (Subcutaneous Tissue): Yes N/A N/A ?Exposed Structures: ?Fascia: No ?Tendon: No ?Muscle: No ?Joint: No ?Bone: No ?Large (67-100%) N/A N/A ?Epithelialization: ?Debridement - Selective/Open Wound N/A N/A ?Debridement: ?Pre-procedure Verification/Time Out 08:16 N/A N/A ?Taken: ?Necrotic/Eschar N/A N/A ?Tissue Debrided: ?Non-Viable Tissue N/A N/A ?Level: ?0.5 N/A N/A ?Debridement A (sq cm): ?rea ?Curette N/A N/A ?Instrument: ?Minimum N/A N/A ?Bleeding: ?Pressure N/A N/A ?Hemostasis A chieved: ?0 N/A N/A ?Procedural Pain: ?0 N/A N/A ?Post Procedural Pain: ?Procedure was tolerated well N/A N/A ?Debridement Treatment Response: ?0.1x0.1x0.1 N/A N/A ?Post Debridement Measurements L x ?W x D (cm) ?0.001 N/A N/A ?Post Debridement Volume: (cm?) ?Debridement N/A N/A ?Procedures Performed: ?Treatment Notes ?Wound #1 (Lower Leg) Wound Laterality: Right, Medial, Anterior ?Cleanser ?Soap and Water ?Discharge Instruction: May shower and wash wound with dial antibacterial soap and water prior to dressing  change. ?Wound Cleanser ?Discharge Instruction: Cleanse the wound with wound cleanser prior to applying a clean dressing using gauze sponges, not tissue or cotton balls. ?Peri-Wound Care ?Triamcinolone 15 (g) ?Discharge Instruction: Use triamcinolone 15 (g) as directed ?Sween Lotion (Moisturizing lotion) ?Discharge Instruction: Apply moisturizing lotion as directed ?Topical ?Primary Dressing ?Promogran Prisma Matrix, 4.34 (sq in) (silver collagen) ?Discharge Instruction: Moisten collagen with saline or hydrogel ?Secondary Dressing ?Woven Gauze Sponge, Non-Sterile 4x4 in ?Discharge Instruction: Apply over primary dressing as directed. ?Secured With ?Compression Wrap ?Kerlix Roll 4.5x3.1 (in/yd) ?Discharge Instruction: Apply Kerlix and Coban compression as directed. ?Coban Self-Adherent Wrap 4x5 (in/yd) ?Discharge Instruction: Apply over Kerlix as directed. ?Compression Stockings ?Add-Ons ?Electronic Signature(s) ?Signed: 05/18/2021 8:34:46 AM By: Fredirick Maudlin MD FACS ?Signed: 05/21/2021 5:51:04 PM By: Adline Peals ?Entered By: Fredirick Maudlin on 05/18/2021 08:34:46 ?-------------------------------------------------------------------------------- ?Multi-Disciplinary Care Plan Details ?Patient Name: ?Date of Service: ?[name] [name], [name] [name] RD 05/18/2021 7:45 A M ?Medical Record Number: [id number] ?Patient Account Number: [id number] ?Date of Birth/Sex: ?Treating RN: ?[birth year] (51 y.o. Janyth Contes ?Primary Care Jorryn Hershberger: PA [place], [place] ?Other Clinician: ?Referring Hillary Struss: ?Treating Layson Bertsch/Extender: Fredirick Maudlin ?Monica Martinez ?Weeks in Treatment: 10 ?Active Inactive ?Wound/Skin Impairment ?Nursing Diagnoses: ?Impaired tissue integrity ?Knowledge deficit related to ulceration/compromised skin integrity ?Goals: ?Patient/caregiver will verbalize understanding of skin care regimen ?Date Initiated: 03/05/2021 ?Target Resolution Date: 05/25/2021 ?Goal Status: Active ?Ulcer/skin breakdown will have a volume  reduction of 30% by week 4 ?Date Initiated: 03/05/2021 ?Date Inactivated: 03/30/2021 ?Target Resolution Date: 04/06/2021 ?Goal Status: Met ?Interventions: ?Assess patient/caregiver ability to obtain necessary sup

## 2021-05-21 NOTE — Progress Notes (Signed)
ELLWYN, ERGLE ([id number]) ?Visit Report for 05/18/2021 ?Chief Complaint Document Details ?Patient Name: Date of Service: ?EXAVIER, LINA RD 05/18/2021 7:45 A M ?Medical Record Number: [id number] ?Patient Account Number: [id number] ?Date of Birth/Sex: Treating RN: ?[birth year] (51 y.o. [name] ?Primary Care Provider: PA Zenovia Jordan, NO Other Clinician: ?Referring Provider: ?Treating Provider/Extender: [name] ?[name] ?Weeks in Treatment: 10 ?Information Obtained from: Patient ?Chief Complaint ?ADMISSION: ?03/09/2021: Patient seen for complaints of Non-Healing Wound. ?Electronic Signature(s) ?Signed: 05/18/2021 8:34:52 AM By: [name] MD FACS ?Entered By: [name] on 05/18/2021 08:34:52 ?-------------------------------------------------------------------------------- ?Debridement Details ?Patient Name: Date of Service: ?ROSENDO, COUSER RD 05/18/2021 7:45 A M ?Medical Record Number: [id number] ?Patient Account Number: [id number] ?Date of Birth/Sex: Treating RN: ?1970/11/02 (51 y.o. [name] ?Primary Care Provider: PA Zenovia Jordan, NO Other Clinician: ?Referring Provider: ?Treating Provider/Extender: [name] ?[name] ?Weeks in Treatment: 10 ?Debridement Performed for Assessment: Wound #1 Right,Medial,Anterior Lower Leg ?Performed By: Physician [name], MD ?Debridement Type: Debridement ?Level of Consciousness (Pre-procedure): Awake and Alert ?Pre-procedure Verification/Time Out Yes - 08:16 ?Taken: ?Start Time: 08:16 ?T Area Debrided (L x W): ?otal 1 (cm) x 0.5 (cm) = 0.5 (cm?) ?Tissue and other material debrided: Non-Viable, Eschar ?Level: Non-Viable Tissue ?Debridement Description: Selective/Open Wound ?Instrument: Curette ?Bleeding: Minimum ?Hemostasis Achieved: Pressure ?End Time: 08:17 ?Procedural Pain: 0 ?Post Procedural Pain: 0 ?Response to Treatment: Procedure was tolerated well ?Level of Consciousness (Post- Awake and Alert ?procedure): ?Post  Debridement Measurements of Total Wound ?Length: (cm) 0.1 ?Width: (cm) 0.1 ?Depth: (cm) 0.1 ?Volume: (cm?) 0.001 ?Character of Wound/Ulcer Post Debridement: Improved ?Post Procedure Diagnosis ?Same as Pre-procedure ?Electronic Signature(s) ?Signed: 05/18/2021 8:50:18 AM By: [name] MD FACS ?Signed: 05/18/2021 5:54:45 PM By: Zandra Abts RN, BSN ?Entered By: Zandra Abts on 05/18/2021 08:18:01 ?-------------------------------------------------------------------------------- ?HPI Details ?Patient Name: Date of Service: ?TADARIUS, MALAND RD 05/18/2021 7:45 A M ?Medical Record Number: [id number] ?Patient Account Number: [id number] ?Date of Birth/Sex: Treating RN: ?Nov 16, 1970 (51 y.o. [name] ?Primary Care Provider: PA Zenovia Jordan, NO Other Clinician: ?Referring Provider: ?Treating Provider/Extender: [name] ?[name] ?Weeks in Treatment: 10 ?History of Present Illness ?HPI Description: ADMISSION ?03/05/2021: This is a 51 year old man who was injured at work when a metal bar struck his right lower leg. He presented to the emergency department at [facility]long hospital where he underwent radiographic imaging that did not show a fracture. The laceration was sutured and the patient was discharged from the ?emergency room. He returned to the emergency room to weeks later with separation of his wound. At that time, no additional studies were performed, but he ?was placed on a course of antibiotics (doxycycline and Keflex). He completed those about a week ago. He has continued to have significant pain as well as ?drainage from the site. He has been applying triple antibiotic ointment and a nonstick gauze to the site without significant improvement. He has not experienced ?any fevers or chills. There is some drainage, but significantly less than prior, per his report. He is accompanied by his son today. ?He is otherwise healthy without any significant medical history. He takes no prescribed home  medications. ?CLINICAL DATA: Laceration, hit with metal bar ?EXAM: ?RIGHT TIBIA AND FIBULA - 2 VIEW ?COMPARISON: 06/26/2018 ?FINDINGS: ?Frontal and lateral views of the right tibia and fibula are ?obtained. No acute displaced fracture. Right knee and ankle are well ?aligned. Mild soft tissue swelling involving the anterolateral right ?lower leg. No radiopaque foreign body. ?IMPRESSION: ?1. Mild soft tissue swelling. No fracture or  radiopaque foreign ?body. ?No labs were performed at either emergency department evaluation. ?03/09/2021: The wound is slightly smaller today. He still has some tenderness associated with it. Still with some drainage. Culture taken on Monday returned with ?a very small amount of Escherichia coli, sensitive to gentamicin. He is currently getting topical gentamicin ointment with Iodoflex. He is not in compression. ?03/15/2021: Continued improvement in the wound. Granulation tissue beginning to fill in. Still with some drainage. Still fairly tender, but without signs of significant ?infection. Still with topical gentamicin and Iodoflex with Kerlix and Coban wraps. ?03/23/2021: There is great granulation tissue filling in and covering the tibia now. Slough is significantly decreased. Periwound is the most tender area for him. ?03/30/2021: The wound continues to heal nicely. Granulation tissue has filled nearly the entire depth of the wound at the caudal aspect. Very little slough. ?Significant improvement in pain. ?04/06/2021: His wound continues to heal. The granulation tissue continues to fill and the tunnel is closed. Almost no slough present and his pain is essentially nil. ?We have been using silver collagen. ?04/13/2021: The wound is healing very nicely. At the inferior portion, the granulation tissue has filled to the point that the opening is flush with the surface. Very ?minimal slough. No pain. We have been using Prisma silver collagen. ?04/20/2021: The wound continues to contract and fill  with granulation tissue. Minimal slough, no pain, no concern for infection. He is in Prisma silver collagen. ?04/27/2021: The wound is roughly the same size, but it continues to fill and is much shallower. No slough, no concern for infection. We have been using Prisma ?silver collagen with Kerlix and Coban wrapping. ?05/04/2021: The wound is smaller and has been separated into 2 open sites by epithelium coming across the lower midportion. No slough; small amount of ?eschar. No concern for infection. ?05/11/2021: The wound is nearly closed. There is just a small open portion at the proximal aspect. This is shallow and has a nice bed of granulation tissue. ?05/18/2021: The wound has a scab overlying the surface. Once this was removed, there are a just a few tiny pinholes that remain open. No concern for ?infection. ?Electronic Signature(s) ?Signed: 05/18/2021 8:35:30 AM By: [name] MD FACS ?Entered By: [name] on 05/18/2021 08:35:30 ?-------------------------------------------------------------------------------- ?Physical Exam Details ?Patient Name: ?Date of Service: ?FILMORE, MOLYNEUX RD 05/18/2021 7:45 A M ?Medical Record Number: [id number] ?Patient Account Number: [id number] ?Date of Birth/Sex: ?Treating RN: ?[birth year] (51 y.o. [name] ?Primary Care Provider: PA TIENT, NO ?Other Clinician: ?Referring Provider: ?Treating Provider/Extender: [name] ?[name] ?Weeks in Treatment: 10 ?Constitutional ?Slightly hypertensive. . . . No acute distress. ?Respiratory ?Normal work of breathing on room air. ?Notes ?05/18/2021: There was some scab and eschar overlying the wound surface. Once this was removed, there were just a few tiny pinhole openings. The surface is ?clean without debris or slough. ?Electronic Signature(s) ?Signed: 05/18/2021 8:36:33 AM By: [name] MD FACS ?Entered By: [name] on 05/18/2021  08:36:33 ?-------------------------------------------------------------------------------- ?Physician Orders Details ?Patient Name: ?Date of Service: ?RAYMONDO, GARCIALOPEZ RD 05/18/2021 7:45 A M ?Medical Record Number: [id number] ?Patient Account Number: [id number] ?Date of Birth/Sex:

## 2021-05-25 ENCOUNTER — Encounter (HOSPITAL_BASED_OUTPATIENT_CLINIC_OR_DEPARTMENT_OTHER): Payer: Self-pay | Admitting: General Surgery

## 2021-05-25 NOTE — Progress Notes (Signed)
[name] [name], [name] [name] ([id number]) Visit Report for 05/25/2021 Arrival Information Details Patient Name: Date of Service: DARE, SANGER RD 05/25/2021 7:45 A M Medical Record Number: [id number] Patient Account Number: [id number] Date of Birth/Sex: Treating RN: 1970-11-11 (51 y.o. [name] [name] Primary Care Khup Sapia: PA Zenovia Jordan, NO Other Clinician: Referring Robyn Nohr: Treating Ki Luckman/Extender: Edwena Bunde Weeks in Treatment: 11 Visit Information History Since Last Visit Added or deleted any medications: No Patient Arrived: Ambulatory Any new allergies or adverse reactions: No Arrival Time: 07:52 Had a fall or experienced change in No Accompanied By: self activities of daily living that may affect Transfer Assistance: None risk of falls: Patient Identification Verified: Yes Signs or symptoms of abuse/neglect since last visito No Secondary Verification Process Completed: Yes Hospitalized since last visit: No Patient Requires Transmission-Based Precautions: No Implantable device outside of the clinic excluding No Patient Has Alerts: No cellular tissue based products placed in the center since last visit: Has Dressing in Place as Prescribed: Yes Pain Present Now: No Electronic Signature(s) Signed: 05/25/2021 3:21:06 PM By: Samuella Bruin Entered By: Samuella Bruin on 05/25/2021 07:52:40 -------------------------------------------------------------------------------- Clinic Level of Care Assessment Details Patient Name: Date of Service: XZAYVIER, FAGIN RD 05/25/2021 7:45 A M Medical Record Number: [id number] Patient Account Number: [id number] Date of Birth/Sex: Treating RN: Nov 29, 1970 (51 y.o. [name] [name] Primary Care Birda Didonato: PA Zenovia Jordan, NO Other Clinician: Referring Roderick Sweezy: Treating Missael Ferrari/Extender: Edwena Bunde Weeks in Treatment: 11 Clinic Level of Care Assessment Items TOOL 4 Quantity Score X- 1 0 Use when  only an EandM is performed on FOLLOW-UP visit ASSESSMENTS - Nursing Assessment / Reassessment X- 1 10 Reassessment of Co-morbidities (includes updates in patient status) X- 1 5 Reassessment of Adherence to Treatment Plan ASSESSMENTS - Wound and Skin A ssessment / Reassessment X - Simple Wound Assessment / Reassessment - one wound 1 5 []  - 0 Complex Wound Assessment / Reassessment - multiple wounds []  - 0 Dermatologic / Skin Assessment (not related to wound area) ASSESSMENTS - Focused Assessment X- 1 5 Circumferential Edema Measurements - multi extremities []  - 0 Nutritional Assessment / Counseling / Intervention X- 1 5 Lower Extremity Assessment (monofilament, tuning fork, pulses) []  - 0 Peripheral Arterial Disease Assessment (using hand held doppler) ASSESSMENTS - Ostomy and/or Continence Assessment and Care []  - 0 Incontinence Assessment and Management []  - 0 Ostomy Care Assessment and Management (repouching, etc.) PROCESS - Coordination of Care X - Simple Patient / Family Education for ongoing care 1 15 []  - 0 Complex (extensive) Patient / Family Education for ongoing care X- 1 10 Staff obtains Consents, Records, T Results / Process Orders est []  - 0 Staff telephones HHA, Nursing Homes / Clarify orders / etc []  - 0 Routine Transfer to another Facility (non-emergent condition) []  - 0 Routine Hospital Admission (non-emergent condition) []  - 0 New Admissions / / Ordering NPWT Apligraf, etc. , []  - 0 Emergency Hospital Admission (emergent condition) X- 1 10 Simple Discharge Coordination []  - 0 Complex (extensive) Discharge Coordination PROCESS - Special Needs []  - 0 Pediatric / Minor Patient Management []  - 0 Isolation Patient Management []  - 0 Hearing / Language / Visual special needs []  - 0 Assessment of Community assistance (transportation, D/C planning, etc.) []  - 0 Additional assistance / Altered mentation []  - 0 Support  Surface(s) Assessment (bed, cushion, seat, etc.) INTERVENTIONS - Wound Cleansing / Measurement X - Simple Wound Cleansing - one wound 1 5 []  - 0 Complex Wound Cleansing - multiple  wounds X- 1 5 Wound Imaging (photographs - any number of wounds) []  - 0 Wound Tracing (instead of photographs) X- 1 5 Simple Wound Measurement - one wound []  - 0 Complex Wound Measurement - multiple wounds INTERVENTIONS - Wound Dressings X - Small Wound Dressing one or multiple wounds 1 10 []  - 0 Medium Wound Dressing one or multiple wounds []  - 0 Large Wound Dressing one or multiple wounds []  - 0 Application of Medications - topical []  - 0 Application of Medications - injection INTERVENTIONS - Miscellaneous []  - 0 External ear exam []  - 0 Specimen Collection (cultures, biopsies, blood, body fluids, etc.) []  - 0 Specimen(s) / Culture(s) sent or taken to Lab for analysis []  - 0 Patient Transfer (multiple staff / Nurse, adultHoyer Lift / Similar devices) []  - 0 Simple Staple / Suture removal (25 or less) []  - 0 Complex Staple / Suture removal (26 or more) []  - 0 Hypo / Hyperglycemic Management (close monitor of Blood Glucose) []  - 0 Ankle / Brachial Index (ABI) - do not check if billed separately X- 1 5 Vital Signs Has the patient been seen at the hospital within the last three years: Yes Total Score: 95 Level Of Care: New/Established - Level 3 Electronic Signature(s) Signed: 05/25/2021 3:21:06 PM By: Samuella BruinHerrington, Taylor Entered By: Samuella BruinHerrington, Taylor on 05/25/2021 08:15:44 -------------------------------------------------------------------------------- Encounter Discharge Information Details Patient Name: Date of Service: [name] [name] RD 05/25/2021 7:45 A M Medical Record Number: [id number] Patient Account Number: [id number] Date of Birth/Sex: Treating RN: 07/22/1970 (51 y.o. [name] PalauM) Herrington, Taylor Primary Care Octavian Godek: PA Zenovia JordanIENT, NO Other Clinician: Referring Quinetta Shilling: Treating Aleila Syverson/Extender:  Edwena Bundeannon, Jennifer Allen, Anthony Terrance Weeks in Treatment: 11 Encounter Discharge Information Items Discharge Condition: Stable Ambulatory Status: Ambulatory Discharge Destination: Home Transportation: Private Auto Accompanied By: self Schedule Follow-up Appointment: Yes Clinical Summary of Care: Patient Declined Electronic Signature(s) Signed: 05/25/2021 3:21:06 PM By: Samuella BruinHerrington, Taylor Entered By: Samuella BruinHerrington, Taylor on 05/25/2021 08:06:29 -------------------------------------------------------------------------------- Lower Extremity Assessment Details Patient Name: Date of Service: [name] [name] RD 05/25/2021 7:45 A M Medical Record Number: [id number] Patient Account Number: [id number] Date of Birth/Sex: Treating RN: 07/19/1970 (51 y.o. [name] PalauM) Herrington, Taylor Primary Care Crissie Aloi: PA Zenovia JordanIENT, NO Other Clinician: Referring Jamol Ginyard: Treating Gabrien Mentink/Extender: Edwena Bundeannon, Jennifer Allen, Anthony Terrance Weeks in Treatment: 11 Edema Assessment Assessed: [Left: No] [Right: No] [Left: Edema] [Right: :] Calf Left: Right: Point of Measurement: 33 cm From Medial Instep 28.8 cm Ankle Left: Right: Point of Measurement: 10 cm From Medial Instep 19.4 cm Vascular Assessment Pulses: Dorsalis Pedis Palpable: [Right:Yes] Electronic Signature(s) Signed: 05/25/2021 3:21:06 PM By: Samuella BruinHerrington, Taylor Entered By: Samuella BruinHerrington, Taylor on 05/25/2021 07:57:25 -------------------------------------------------------------------------------- Multi Wound Chart Details Patient Name: Date of Service: [name] [name] RD 05/25/2021 7:45 A M Medical Record Number: [id number] Patient Account Number: [id number] Date of Birth/Sex: Treating RN: 04/26/1970 (51 y.o. [name] PalauM) Herrington, Taylor Primary Care Lorrena Goranson: PA Zenovia JordanIENT, NO Other Clinician: Referring Rollin Kotowski: Treating Beckham Capistran/Extender: Edwena Bundeannon, Jennifer Allen, Anthony Terrance Weeks in Treatment: 11 Vital Signs Height(in): 71 Pulse(bpm): 82 Weight(lbs):  165 Blood Pressure(mmHg): 142/78 Body Mass Index(BMI): 23 Temperature(F): 98.0 Respiratory Rate(breaths/min): 18 Photos: [N/A:N/A] Right, Medial, Anterior Lower Leg N/A N/A Wound Location: Trauma N/A N/A Wounding Event: Trauma, Other N/A N/A Primary Etiology: 01/19/2021 N/A N/A Date Acquired: 11 N/A N/A Weeks of Treatment: Open N/A N/A Wound Status: No N/A N/A Wound Recurrence: 0x0x0 N/A N/A Measurements L x W x D (cm) 0 N/A N/A A (cm) : rea 0 N/A N/A Volume (cm) : 100.00% N/A N/A % Reduction in A  rea: 100.00% N/A N/A % Reduction in Volume: Full Thickness With Exposed Support N/A N/A Classification: Structures None Present N/A N/A Exudate Amount: Well defined, not attached N/A N/A Wound Margin: None Present (0%) N/A N/A Granulation Amount: None Present (0%) N/A N/A Necrotic Amount: Fascia: No N/A N/A Exposed Structures: Fat Layer (Subcutaneous Tissue): No Tendon: No Muscle: No Joint: No Bone: No Large (67-100%) N/A N/A Epithelialization: Treatment Notes Wound #1 (Lower Leg) Wound Laterality: Right, Medial, Anterior Cleanser Peri-Wound Care Topical Primary Dressing Secondary Dressing Secured With Compression Wrap Compression Stockings Add-Ons Electronic Signature(s) Signed: 05/25/2021 8:14:29 AM By: [name] MD FACS Signed: 05/25/2021 3:21:06 PM By: Samuella Bruin Entered By: [name] on 05/25/2021 08:14:29 -------------------------------------------------------------------------------- Multi-Disciplinary Care Plan Details Patient Name: Date of Service: [name] Endo RD 05/25/2021 7:45 A M Medical Record Number: [id number] Patient Account Number: [id number] Date of Birth/Sex: Treating RN: [birth year] (51 y.o. [name] [name] Primary Care Pasty Manninen: PA Zenovia Jordan, NO Other Clinician: Referring Zell Doucette: Treating Lea Walbert/Extender: Edwena Bunde Weeks in Treatment: 11 Active Inactive Electronic  Signature(s) Signed: 05/25/2021 3:21:06 PM By: Gelene Mink By: Samuella Bruin on 05/25/2021 08:16:25 -------------------------------------------------------------------------------- Pain Assessment Details Patient Name: Date of Service: JAVONI, LUCKEN RD 05/25/2021 7:45 A M Medical Record Number: [id number] Patient Account Number: [id number] Date of Birth/Sex: Treating RN: Oct 22, 1970 (51 y.o. [name] [name] Primary Care Devann Cribb: PA Zenovia Jordan, NO Other Clinician: Referring Girolamo Lortie: Treating Pama Roskos/Extender: Edwena Bunde Weeks in Treatment: 11 Active Problems Location of Pain Severity and Description of Pain Patient Has Paino No Site Locations Rate the pain. Rate the pain. Current Pain Level: 0 Pain Management and Medication Current Pain Management: Electronic Signature(s) Signed: 05/25/2021 3:21:06 PM By: Samuella Bruin Entered By: Samuella Bruin on 05/25/2021 07:52:53 -------------------------------------------------------------------------------- Patient/Caregiver Education Details Patient Name: Date of Service: [name] Endo RD 5/19/2023andnbsp7:45 A M Medical Record Number: [id number] Patient Account Number: [id number] Date of Birth/Gender: Treating RN: 11-Dec-1970 (51 y.o. [name] [name] Primary Care Physician: PA Zenovia Jordan, [place] Other Clinician: Referring Physician: Treating Physician/Extender: Dorthula Rue in Treatment: 11 Education Assessment Education Provided To: Patient Education Topics Provided Wound/Skin Impairment: Methods: Explain/Verbal Responses: Reinforcements needed, State content correctly Electronic Signature(s) Signed: 05/25/2021 3:21:06 PM By: Samuella Bruin Entered By: Samuella Bruin on 05/25/2021 07:58:28 -------------------------------------------------------------------------------- Wound Assessment Details Patient Name: Date of Service: THAISON, KOLODZIEJSKI RD 05/25/2021 7:45 A M Medical Record Number: [id number] Patient Account Number: [id number] Date of Birth/Sex: Treating RN: [birth year] (51 y.o. [name] [name] Primary Care Jaydee Conran: PA TIENT, NO Other Clinician: Referring Rohil Lesch: Treating Andros Channing/Extender: Edwena Bunde Weeks in Treatment: 11 Wound Status Wound Number: 1 Primary Etiology: Trauma, Other Wound Location: Right, Medial, Anterior Lower Leg Wound Status: Open Wounding Event: Trauma Date Acquired: 01/19/2021 Weeks Of Treatment: 11 Clustered Wound: No Photos Wound Measurements Length: (cm) Width: (cm) Depth: (cm) Area: (cm) Volume: (cm) 0 % Reduction in Area: 100% 0 % Reduction in Volume: 100% 0 Epithelialization: Large (67-100%) 0 Tunneling: No 0 Undermining: No Wound Description Classification: Full Thickness With Exposed Support Structures Wound Margin: Well defined, not attached Exudate Amount: None Present Foul Odor After Cleansing: No Slough/Fibrino No Wound Bed Granulation Amount: None Present (0%) Exposed Structure Necrotic Amount: None Present (0%) Fascia Exposed: No Fat Layer (Subcutaneous Tissue) Exposed: No Tendon Exposed: No Muscle Exposed: No Joint Exposed: No Bone Exposed: No Electronic Signature(s) Signed: 05/25/2021 3:21:06 PM By: Samuella Bruin Entered By: Samuella Bruin on 05/25/2021 08:00:36 -------------------------------------------------------------------------------- Vitals Details Patient Name: Date of Service:  STRATTON, VILLWOCK RD 05/25/2021 7:45 A M Medical Record Number: [id number] Patient Account Number: [id number] Date of Birth/Sex: Treating RN: 04-27-1970 (51 y.o. [name] [name] Primary Care Amandalee Lacap: PA Zenovia Jordan, NO Other Clinician: Referring Taaliyah Delpriore: Treating Odin Mariani/Extender: Edwena Bunde Weeks in Treatment: 11 Vital Signs Time Taken: 07:53 Temperature (F): 98.0 Height (in): 71 Pulse  (bpm): 82 Weight (lbs): 165 Respiratory Rate (breaths/min): 18 Body Mass Index (BMI): 23 Blood Pressure (mmHg): 142/78 Reference Range: 80 - 120 mg / dl Electronic Signature(s) Signed: 05/25/2021 3:21:06 PM By: Samuella Bruin Entered By: Samuella Bruin on 05/25/2021 07:54:32

## 2021-05-25 NOTE — Progress Notes (Signed)
[name] [name], [name] [name] ([id number]) Visit Report for 05/25/2021 Chief Complaint Document Details Patient Name: Date of Service: [name] [name], [name] [name] 05/25/2021 7:45 A M Medical Record Number: [id number] Patient Account Number: [id number] Date of Birth/Sex: Treating RN: [birth year] (51 y.o. Janyth Contes Primary Care Provider: PA Haig Prophet, [place] Other Clinician: Referring Provider: Treating Provider/Extender: Henrine Screws Weeks in Treatment: 11 Information Obtained from: Patient Chief Complaint ADMISSION: 03/09/2021: Patient seen for complaints of Non-Healing Wound. Electronic Signature(s) Signed: 05/25/2021 8:14:36 AM By: Fredirick Maudlin MD FACS Entered By: Fredirick Maudlin on 05/25/2021 08:14:35 -------------------------------------------------------------------------------- HPI Details Patient Name: Date of Service: [name] [name] 05/25/2021 7:45 A M Medical Record Number: [id number] Patient Account Number: [id number] Date of Birth/Sex: Treating RN: [birth year] (51 y.o. Janyth Contes Primary Care Provider: PA Haig Prophet, [place] Other Clinician: Referring Provider: Treating Provider/Extender: Henrine Screws Weeks in Treatment: 11 History of Present Illness HPI Description: ADMISSION 03/05/2021: This is a 51 year old man who was injured at work when a metal bar struck his right lower leg. He presented to the emergency department at [facility] where he underwent radiographic imaging that did not show a fracture. The laceration was sutured and the patient was discharged from the emergency room. He returned to the emergency room to weeks later with separation of his wound. At that time, no additional studies were performed, but he was placed on a course of antibiotics (doxycycline and Keflex). He completed those about a week ago. He has continued to have significant pain as well as drainage from the site. He has been applying triple antibiotic  ointment and a nonstick gauze to the site without significant improvement. He has not experienced any fevers or chills. There is some drainage, but significantly less than prior, per his report. He is accompanied by his son today. He is otherwise healthy without any significant medical history. He takes no prescribed home medications. CLINICAL DATA: Laceration, hit with metal bar EXAM: RIGHT TIBIA AND FIBULA - 2 VIEW COMPARISON: 06/26/2018 FINDINGS: Frontal and lateral views of the right tibia and fibula are obtained. No acute displaced fracture. Right knee and ankle are well aligned. Mild soft tissue swelling involving the anterolateral right lower leg. No radiopaque foreign body. IMPRESSION: 1. Mild soft tissue swelling. No fracture or radiopaque foreign body. No labs were performed at either emergency department evaluation. 03/09/2021: The wound is slightly smaller today. He still has some tenderness associated with it. Still with some drainage. Culture taken on Monday returned with a very small amount of Escherichia coli, sensitive to gentamicin. He is currently getting topical gentamicin ointment with Iodoflex. He is not in compression. 03/15/2021: Continued improvement in the wound. Granulation tissue beginning to fill in. Still with some drainage. Still fairly tender, but without signs of significant infection. Still with topical gentamicin and Iodoflex with Kerlix and Coban wraps. 03/23/2021: There is great granulation tissue filling in and covering the tibia now. Slough is significantly decreased. Periwound is the most tender area for him. 03/30/2021: The wound continues to heal nicely. Granulation tissue has filled nearly the entire depth of the wound at the caudal aspect. Very little slough. Significant improvement in pain. 04/06/2021: His wound continues to heal. The granulation tissue continues to fill and the tunnel is closed. Almost no slough present and his pain is essentially  nil. We have been using silver collagen. 04/13/2021: The wound is healing very nicely. At the inferior portion, the granulation tissue has filled to the point that the opening is flush  with the surface. Very minimal slough. No pain. We have been using Prisma silver collagen. 04/20/2021: The wound continues to contract and fill with granulation tissue. Minimal slough, no pain, no concern for infection. He is in Prisma silver collagen. 04/27/2021: The wound is roughly the same size, but it continues to fill and is much shallower. No slough, no concern for infection. We have been using Prisma silver collagen with Kerlix and Coban wrapping. 05/04/2021: The wound is smaller and has been separated into 2 open sites by epithelium coming across the lower midportion. No slough; small amount of eschar. No concern for infection. 05/11/2021: The wound is nearly closed. There is just a small open portion at the proximal aspect. This is shallow and has a nice bed of granulation tissue. 05/18/2021: The wound has a scab overlying the surface. Once this was removed, there are a just a few tiny pinholes that remain open. No concern for infection. 05/25/2021: His wound has healed. Electronic Signature(s) Signed: 05/25/2021 8:15:26 AM By: Fredirick Maudlin MD FACS Entered By: Fredirick Maudlin on 05/25/2021 08:15:26 -------------------------------------------------------------------------------- Physical Exam Details Patient Name: Date of Service: [name] [name], [name] [name] 05/25/2021 7:45 A M Medical Record Number: [id number] Patient Account Number: [id number] Date of Birth/Sex: Treating RN: [birth year] (51 y.o. Janyth Contes Primary Care Provider: PA Haig Prophet, [place] Other Clinician: Referring Provider: Treating Provider/Extender: Henrine Screws Weeks in Treatment: 11 Constitutional . . . . No acute distress. Respiratory Normal work of breathing on room air. Notes 05/25/2021: The wound is  healed. Electronic Signature(s) Signed: 05/25/2021 8:15:57 AM By: Fredirick Maudlin MD FACS Entered By: Fredirick Maudlin on 05/25/2021 08:15:56 -------------------------------------------------------------------------------- Physician Orders Details Patient Name: Date of Service: [name] [name], [name] [name] 05/25/2021 7:45 A M Medical Record Number: [id number] Patient Account Number: [id number] Date of Birth/Sex: Treating RN: [birth year] [name] [name] (51 y.o. 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 Edema Control - Lymphedema / SCD / Other Right Lower Extremity Elevate legs to the level of the heart or above for [name] minutes daily and/or when sitting, a frequency of: - Throughout the day Avoid standing for long periods of time. Exercise regularly Electronic Signature(s) Signed: 05/25/2021 8:16:07 AM By: Fredirick Maudlin MD FACS Entered By: Fredirick Maudlin on 05/25/2021 08:16:07 -------------------------------------------------------------------------------- Problem List Details Patient Name: Date of Service: [name] [name] 05/25/2021 7:45 A M Medical Record Number: [id number] Patient Account Number: [id number] Date of Birth/Sex: Treating RN: [birth year][name] [name] (51 y.o. Janyth Contes Primary Care Provider: PA Haig Prophet, [place] Other Clinician: Referring Provider: Treating Provider/Extender: Henrine Screws Weeks in Treatment: 11 Active Problems ICD-10 Encounter Code Description Active Date MDM Diagnosis L97.813 Non-pressure chronic ulcer of other part of right lower leg with necrosis of 03/05/2021 No  Yes muscle Inactive Problems Resolved Problems Electronic Signature(s) Signed: 05/25/2021 8:14:24 AM By: Fredirick Maudlin MD FACS Entered By: Fredirick Maudlin on 05/25/2021 08:14:24 -------------------------------------------------------------------------------- Progress Note Details Patient Name: Date of Service: [name] [name] 05/25/2021 7:45 A M Medical Record Number: [id number] Patient Account Number: [id number] Date of Birth/Sex: Treating RN: [name] [name], [name] [name] (51 y.o. Janyth Contes Primary Care Provider: Other Clinician: PA Darnelle Spangle Referring Provider: Treating Provider/Extender: Henrine Screws Weeks in Treatment: 11 Subjective  Chief Complaint Information obtained from Patient ADMISSION: 03/09/2021: Patient seen for complaints of Non-Healing Wound. History of Present Illness (HPI) ADMISSION 03/05/2021: This is a 51 year old man who was injured at work when a metal bar struck his right lower leg. He presented to the emergency department at [facility] where he underwent radiographic imaging that did not show a fracture. The laceration was sutured and the patient was discharged from the emergency room. He returned to the emergency room to weeks later with separation of his wound. At that time, no additional studies were performed, but he was placed on a course of antibiotics (doxycycline and Keflex). He completed those about a week ago. He has continued to have significant pain as well as drainage from the site. He has been applying triple antibiotic ointment and a nonstick gauze to the site without significant improvement. He has not experienced any fevers or chills. There is some drainage, but significantly less than prior, per his report. He is accompanied by his son today. He is otherwise healthy without any significant medical history. He takes no prescribed home medications. CLINICAL DATA: Laceration, hit with metal bar EXAM: RIGHT TIBIA AND  FIBULA - 2 VIEW COMPARISON: 06/26/2018 FINDINGS: Frontal and lateral views of the right tibia and fibula are obtained. No acute displaced fracture. Right knee and ankle are well aligned. Mild soft tissue swelling involving the anterolateral right lower leg. No radiopaque foreign body. IMPRESSION: 1. Mild soft tissue swelling. No fracture or radiopaque foreign body. No labs were performed at either emergency department evaluation. 03/09/2021: The wound is slightly smaller today. He still has some tenderness associated with it. Still with some drainage. Culture taken on Monday returned with a very small amount of Escherichia coli, sensitive to gentamicin. He is currently getting topical gentamicin ointment with Iodoflex. He is not in compression. 03/15/2021: Continued improvement in the wound. Granulation tissue beginning to fill in. Still with some drainage. Still fairly tender, but without signs of significant infection. Still with topical gentamicin and Iodoflex with Kerlix and Coban wraps. 03/23/2021: There is great granulation tissue filling in and covering the tibia now. Slough is significantly decreased. Periwound is the most tender area for him. 03/30/2021: The wound continues to heal nicely. Granulation tissue has filled nearly the entire depth of the wound at the caudal aspect. Very little slough. Significant improvement in pain. 04/06/2021: His wound continues to heal. The granulation tissue continues to fill and the tunnel is closed. Almost no slough present and his pain is essentially nil. We have been using silver collagen. 04/13/2021: The wound is healing very nicely. At the inferior portion, the granulation tissue has filled to the point that the opening is flush with the surface. Very minimal slough. No pain. We have been using Prisma silver collagen. 04/20/2021: The wound continues to contract and fill with granulation tissue. Minimal slough, no pain, no concern for infection. He is in  Prisma silver collagen. 04/27/2021: The wound is roughly the same size, but it continues to fill and is much shallower. No slough, no concern for infection. We have been using Prisma silver collagen with Kerlix and Coban wrapping. 05/04/2021: The wound is smaller and has been separated into 2 open sites by epithelium coming across the lower midportion. No slough; small amount of eschar. No concern for infection. 05/11/2021: The wound is nearly closed. There is just a small open portion at the proximal aspect. This is shallow and has a nice bed of granulation tissue. 05/18/2021: The wound has  a scab overlying the surface. Once this was removed, there are a just a few tiny pinholes that remain open. No concern for infection. 05/25/2021: His wound has healed. Patient History Information obtained from Patient. Social History Current some day smoker - 1 pack per day - started on 11/07/1991, Marital Status - Divorced, Alcohol Use - Rarely, Drug Use - No History, Caffeine Use - Rarely. Medical History Integumentary (Skin) Denies history of History of Burn Hospitalization/Surgery History - Tooth extractions. Medical A Surgical History Notes nd Musculoskeletal Heel Fracture Objective Constitutional No acute distress. Vitals Time Taken: 7:53 AM, Height: 71 in, Weight: 165 lbs, BMI: 23, Temperature: 98.0 F, Pulse: 82 bpm, Respiratory Rate: 18 breaths/min, Blood Pressure: 142/78 mmHg. Respiratory Normal work of breathing on room air. General Notes: 05/25/2021: The wound is healed. Integumentary (Hair, Skin) Wound #1 status is Open. Original cause of wound was Trauma. The date acquired was: 01/19/2021. The wound has been in treatment 11 weeks. The wound is located on the Right,Medial,Anterior Lower Leg. The wound measures 0cm length x 0cm width x 0cm depth; 0cm^2 area and 0cm^3 volume. There is no tunneling or undermining noted. There is a none present amount of drainage noted. The wound margin is well  defined and not attached to the wound base. There is no granulation within the wound bed. There is no necrotic tissue within the wound bed. 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 Edema Control - Lymphedema / SCD / Other: Elevate legs to the level of the heart or above for [name] minutes daily and/or when sitting, a frequency of: - Throughout the day Avoid standing for long periods of time. Exercise regularly 05/25/2021: His wound has healed. We will discharge him from the wound care center. He may follow-up as needed. Electronic Signature(s) Signed: 05/25/2021 8:16:25 AM By: Fredirick Maudlin MD FACS Entered By: Fredirick Maudlin on 05/25/2021 08:16:25 -------------------------------------------------------------------------------- HxROS Details Patient Name: Date of Service: [name] [name] 05/25/2021 7:45 A M Medical Record Number: [id number] Patient Account Number: [id number] Date of Birth/Sex: Treating RN: [birth year] (51 y.o. Janyth Contes Primary Care Provider: PA Haig Prophet, [place] Other Clinician: Referring Provider: Treating Provider/Extender: Henrine Screws Weeks in Treatment: 11 Information Obtained From Patient Integumentary (Skin) Medical History: Negative for: History of Burn Musculoskeletal Medical History: Past Medical History Notes: Heel Fracture Immunizations Pneumococcal Vaccine: Received Pneumococcal Vaccination: No Implantable Devices None Hospitalization / Surgery History Type of Hospitalization/Surgery Tooth extractions Family and Social History Current some day smoker - 1 pack per day - started on 11/07/1991; Marital Status - Divorced; Alcohol Use: Rarely; Drug Use: No History; Caffeine Use: Rarely; Financial Concerns: No; Food, Clothing or Shelter Needs: No; Support System  Lacking: No; Transportation Concerns: No Electronic Signature(s) Signed: 05/25/2021 12:16:20 PM By: Fredirick Maudlin MD FACS Signed: 05/25/2021 3:21:06 PM By: Sabas Sous By: Fredirick Maudlin on 05/25/2021 08:15:32 -------------------------------------------------------------------------------- SuperBill Details Patient Name: Date of Service: [name] [name], [name] [name] 05/25/2021 Medical Record Number: [id number] Patient Account Number: [id number] Date of Birth/Sex: Treating RN: [name] [name] (51 y.o. 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

## 2023-04-25 IMAGING — CR DG TIBIA/FIBULA 2V*R*
4 series · 4 of 4 positions shown · non-contrast
Comparison: 06/26/2018

CLINICAL DATA: Laceration, hit with metal bar

EXAM:
RIGHT TIBIA AND FIBULA - 2 VIEW

[t tib-fib ap right (1 of 2)]
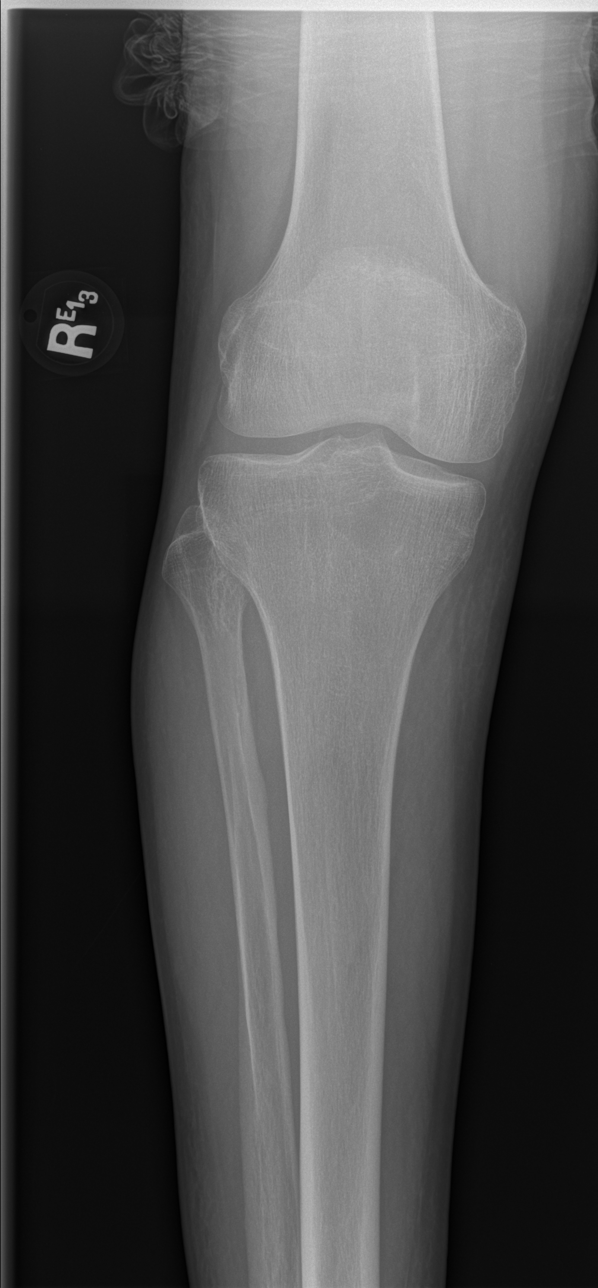

[t tib-fib ap right (2 of 2)]
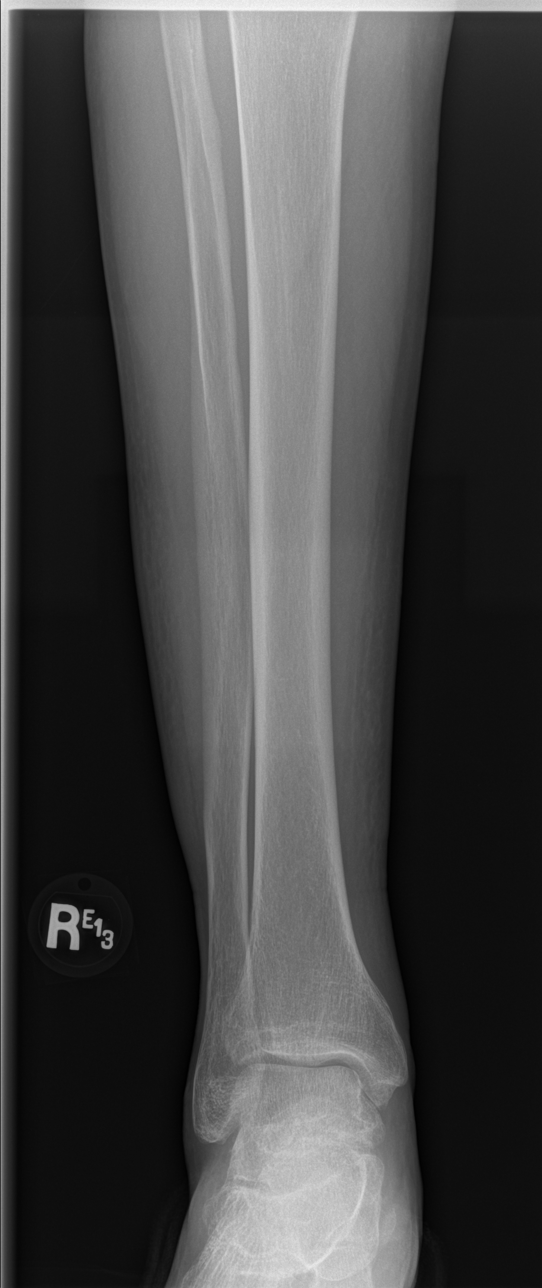

[t tib-fib lat right (1 of 2)]
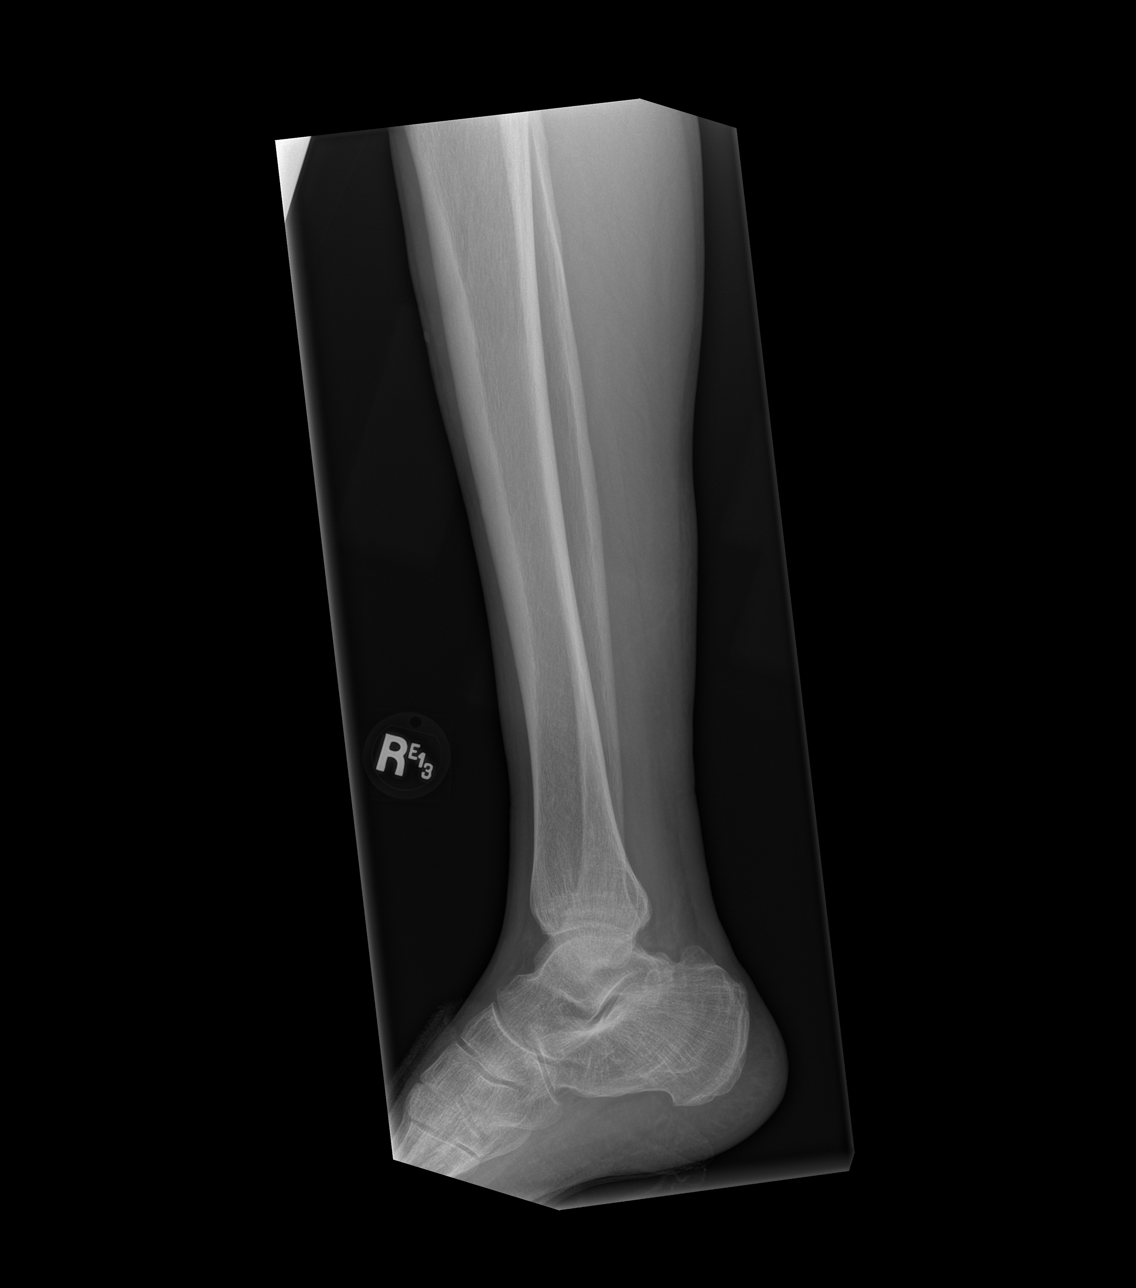

[t tib-fib lat right (2 of 2)]
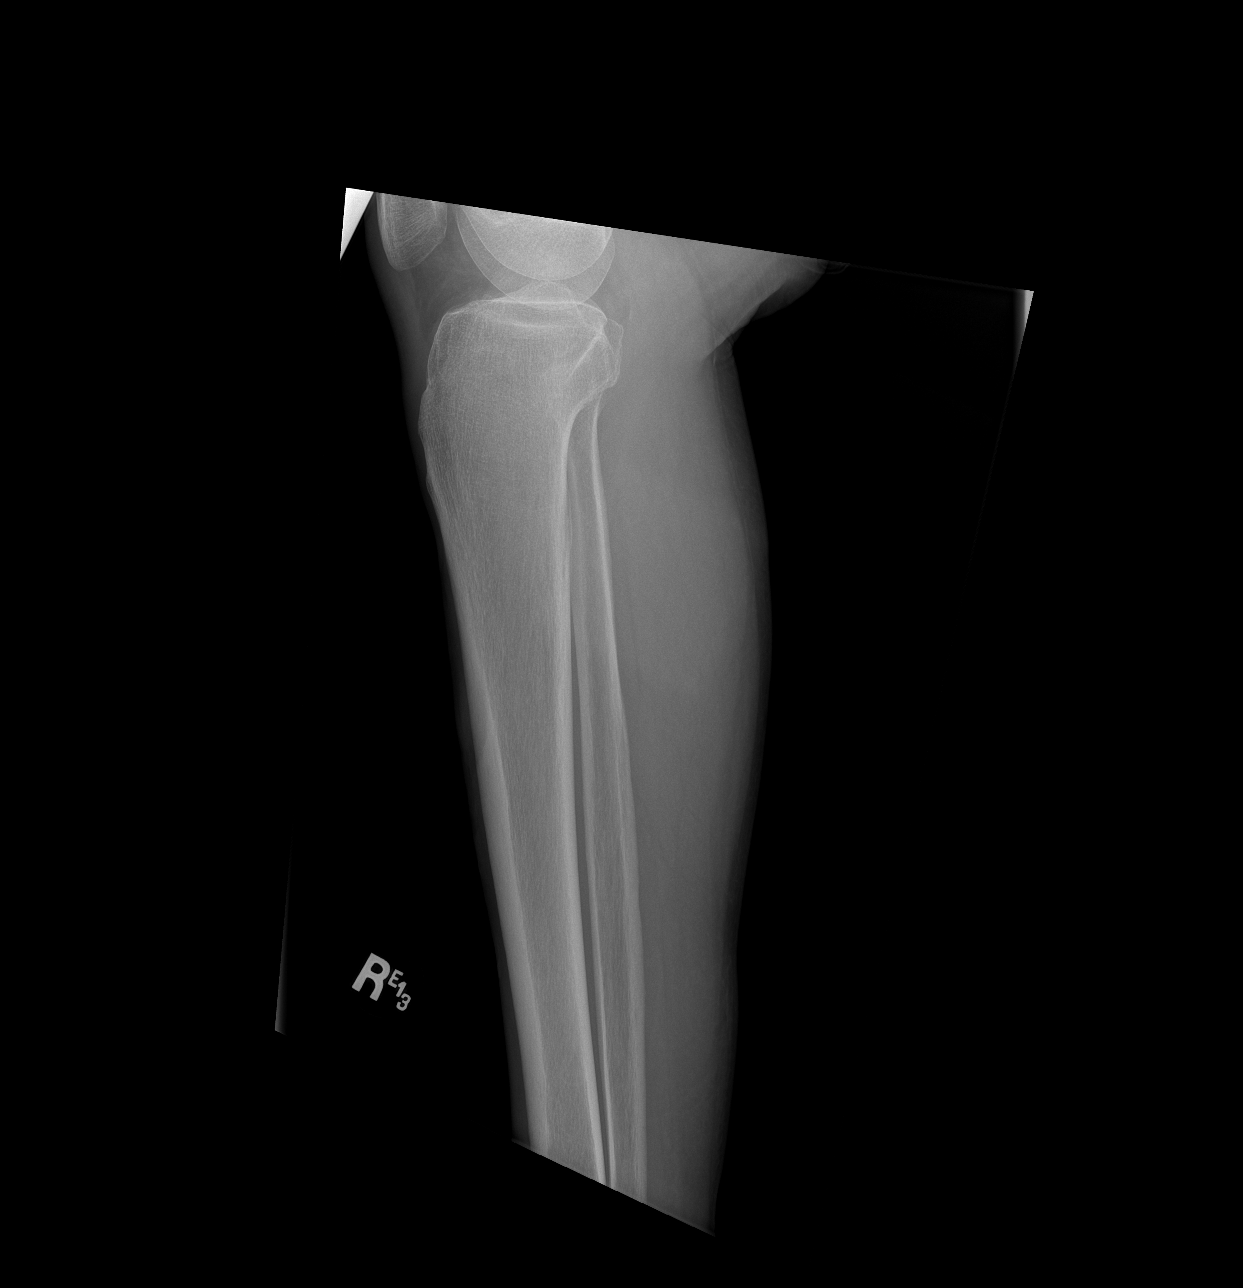

[4 of 4 positions shown; findings below may reference images not displayed]

FINDINGS: Frontal and lateral views of the right tibia and fibula are
obtained. No acute displaced fracture. Right knee and ankle are well
aligned. Mild soft tissue swelling involving the anterolateral right
lower leg. No radiopaque foreign body.
IMPRESSION: 1. Mild soft tissue swelling. No fracture or radiopaque foreign
body.
